# Patient Record
Sex: Female | Born: 1995 | Race: White | Hispanic: No | Marital: Single | Smoking: Former smoker
Health system: Southern US, Community
[De-identification: ages and names within clinical notes are randomized; demographics above are authoritative.]

## PROBLEM LIST (undated history)

## (undated) DIAGNOSIS — K589 Irritable bowel syndrome without diarrhea: Secondary | ICD-10-CM

## (undated) DIAGNOSIS — F909 Attention-deficit hyperactivity disorder, unspecified type: Secondary | ICD-10-CM

## (undated) DIAGNOSIS — G43909 Migraine, unspecified, not intractable, without status migrainosus: Secondary | ICD-10-CM

## (undated) DIAGNOSIS — F419 Anxiety disorder, unspecified: Secondary | ICD-10-CM

## (undated) DIAGNOSIS — J45909 Unspecified asthma, uncomplicated: Secondary | ICD-10-CM

## (undated) DIAGNOSIS — F329 Major depressive disorder, single episode, unspecified: Secondary | ICD-10-CM

## (undated) DIAGNOSIS — F39 Unspecified mood [affective] disorder: Secondary | ICD-10-CM

## (undated) DIAGNOSIS — F32A Depression, unspecified: Secondary | ICD-10-CM

## (undated) HISTORY — DX: Irritable bowel syndrome, unspecified: K58.9

## (undated) HISTORY — DX: Unspecified mood (affective) disorder: F39

## (undated) HISTORY — PX: ROOT CANAL: SHX2363

## (undated) HISTORY — PX: TYMPANOSTOMY TUBE PLACEMENT: SHX32

## (undated) HISTORY — DX: Attention-deficit hyperactivity disorder, unspecified type: F90.9

---
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
## 2017-03-06 ENCOUNTER — Encounter (HOSPITAL_COMMUNITY): Payer: Self-pay | Admitting: Emergency Medicine

## 2017-03-06 ENCOUNTER — Emergency Department (HOSPITAL_COMMUNITY)
Admission: EM | Admit: 2017-03-06 | Discharge: 2017-03-06 | Disposition: A | Discharge status: Home / Self Care | Payer: 59 | Attending: Emergency Medicine | Admitting: Emergency Medicine

## 2017-03-06 DIAGNOSIS — G43829 Menstrual migraine, not intractable, without status migrainosus: Secondary | ICD-10-CM | POA: Insufficient documentation

## 2017-03-06 DIAGNOSIS — Z79899 Other long term (current) drug therapy: Secondary | ICD-10-CM | POA: Insufficient documentation

## 2017-03-06 DIAGNOSIS — R51 Headache: Secondary | ICD-10-CM | POA: Diagnosis present

## 2017-03-06 MED ORDER — PROCHLORPERAZINE EDISYLATE 5 MG/ML IJ SOLN
10.0000 mg | Freq: Once | INTRAMUSCULAR | Status: AC
  Administered 2017-03-06: 10 mg via INTRAVENOUS
  Filled 2017-03-06: qty 2

## 2017-03-06 MED ORDER — DIPHENHYDRAMINE HCL 50 MG/ML IJ SOLN
25.0000 mg | Freq: Once | INTRAMUSCULAR | Status: AC
  Administered 2017-03-06: 25 mg via INTRAVENOUS
  Filled 2017-03-06: qty 1

## 2017-03-06 MED ORDER — KETOROLAC TROMETHAMINE 30 MG/ML IJ SOLN
30.0000 mg | Freq: Once | INTRAMUSCULAR | Status: AC
  Administered 2017-03-06: 30 mg via INTRAVENOUS
  Filled 2017-03-06: qty 1

## 2017-03-06 MED ORDER — SODIUM CHLORIDE 0.9 % IV BOLUS (SEPSIS)
1000.0000 mL | Freq: Once | INTRAVENOUS | Status: AC
  Administered 2017-03-06: 1000 mL via INTRAVENOUS

## 2017-03-06 NOTE — ED Notes (Signed)
Requested urine from patient. 

## 2017-03-06 NOTE — ED Notes (Signed)
Bed: [facility] Expected date:  Expected time:  Means of arrival:  Comments: 

## 2017-03-06 NOTE — ED Provider Notes (Signed)
[facility]-EMERGENCY DEPT Provider Note   CSN: [id number] Arrival date & time: 03/06/17  0509     History   Chief Complaint Chief Complaint  Patient presents with  . Migraine  . Emesis    HPI Melissa Escobar is a 21 y.o. female.  HPI  This is a 21 year old female with a history of migraines who presents with headache. Patient reports worsening frontal headache since last night. She has a history of menstrual migraines. She usually takes Imitrex. However, last night she states that she drank 3 alcoholic beverages and was afraid to take her Imitrex. She reports nonbilious, nonbloody emesis.  Patient currently rates her pain at 10 out of 10. It is frontal headache. No neck pain or fevers.  Past Medical History:  Diagnosis Date  . Anxiety   . Depression     There are no active problems to display for this patient.   History reviewed. No pertinent surgical history.  OB History    No data available       Home Medications    Prior to Admission medications   Medication Sig Start Date End Date Taking? Authorizing Provider  albuterol (PROVENTIL HFA;VENTOLIN HFA) 108 (90 Base) MCG/ACT inhaler Inhale 1-2 puffs into the lungs every 6 (six) hours as needed for wheezing or shortness of breath.    [provider]  cetirizine (ZYRTEC) 10 MG tablet Take 10 mg by mouth daily as needed for allergies.     [provider]  lisdexamfetamine (VYVANSE) 20 MG capsule Take 20 mg by mouth daily.    [provider]  montelukast (SINGULAIR) 10 MG tablet Take 10 mg by mouth at bedtime as needed (allergies).  11/16/14 11/09/16  [provider]  ondansetron (ZOFRAN) 8 MG tablet Take 8 mg by mouth every 8 (eight) hours as needed for nausea.  10/18/16   [provider]  oxyCODONE (ROXICODONE) 5 MG immediate release tablet Take 1 tablet (5 mg total) by mouth every 4 (four) hours as needed for severe pain. Patient not taking: Reported on 11/09/2016 02/29/16   Earley Favor, NP  SUMAtriptan (IMITREX) 50 MG tablet Take 50 mg by mouth every 2 (two) hours as needed for headache.  10/23/16   [provider]    Family History History reviewed. No pertinent family history.  Social History Social History  Substance Use Topics  . Smoking status: Never Smoker  . Smokeless tobacco: Never Used  . Alcohol use No     Allergies   Tamiflu [oseltamivir phosphate]   Review of Systems Review of Systems  Constitutional: Negative for fever.  Respiratory: Negative for shortness of breath.   Cardiovascular: Negative for chest pain.  Gastrointestinal: Positive for nausea and vomiting.  Musculoskeletal: Negative for neck pain and neck stiffness.  Neurological: Positive for headaches.  All other systems reviewed and are negative.    Physical Exam Updated Vital Signs BP 108/75 (BP Location: Left Arm)   Pulse 73   Temp (!) 97.5 F (36.4 C) (Oral)   Resp 15   Ht  (1.727 m)   Wt 59 kg (130 lb)   LMP 02/28/2017   SpO2 98%   BMI 19.77 kg/m   Physical Exam  Constitutional: She is oriented to person, place, and time. No distress.  HENT:  Head: Normocephalic and atraumatic.  Eyes: Pupils are equal, round, and reactive to light.  Neck: Normal range of motion. Neck supple.  Cardiovascular: Normal rate, regular rhythm and normal heart sounds.  Pulmonary/Chest: Effort normal and breath sounds normal. No respiratory distress. She has no wheezes.  Abdominal: Soft. There is no tenderness.  Neurological: She is alert and oriented to person, place, and time.  5 out of 5 strength in all 4 extremities, no dysmetria to finger-nose-finger  Skin: Skin is warm and dry.  Psychiatric: She has a normal mood and affect.  Nursing note and vitals reviewed.    ED Treatments / Results  Labs (all labs ordered are listed, but only abnormal results are displayed) Labs Reviewed  URINALYSIS, ROUTINE W REFLEX MICROSCOPIC  PREGNANCY, URINE    EKG  EKG  Interpretation None       Radiology No results found.  Procedures Procedures (including critical care time)  Medications Ordered in ED Medications  ketorolac (TORADOL) 30 MG/ML injection 30 mg (30 mg Intravenous Given 03/06/17 0623)  sodium chloride 0.9 % bolus 1,000 mL (1,000 mLs Intravenous New Bag/Given 03/06/17 4782)  diphenhydrAMINE (BENADRYL) injection 25 mg (25 mg Intravenous Given 03/06/17 0623)  prochlorperazine (COMPAZINE) injection 10 mg (10 mg Intravenous Given 03/06/17 9562)     Initial Impression / Assessment and Plan / ED Course  I have reviewed the triage vital signs and the nursing notes.  Pertinent labs & imaging results that were available during my care of the patient were reviewed by me and considered in my medical decision making (see chart for details).  Clinical Course as of Mar 06 736  Wed Mar 06, 2017  0654 REsting comfortably.  Just received meds.  Will re-evaluate.  [CH]    Clinical Course User Index [CH] Taylin Mans, Mayer Masker, MD    Patient presents with migraine. Classic for her migraines. Patient given migraine cocktail and fluids. See clinical course above. On recheck, patient reports she feels better and is resting comfortably. Will discharge home with her mother.  After history, exam, and medical workup I feel the patient has been appropriately medically screened and is safe for discharge home. Pertinent diagnoses were discussed with the patient. Patient was given return precautions.   Final Clinical Impressions(s) / ED Diagnoses   Final diagnoses:  Menstrual migraine without status migrainosus, not intractable    New Prescriptions New Prescriptions   No medications on file     Shon Baton, MD 03/06/17 [id number]

## 2017-03-06 NOTE — ED Triage Notes (Signed)
Patient complaining of migraine that starting hurting yesterday. Patient states she has been vomiting. Patient states she feels faint and dehydrated. Patient has had alcohol so that is why she did not take her migraine medication (imitrex).

## 2017-03-06 NOTE — ED Notes (Signed)
Patient states she is a lesbian and does not have sex with men.

## 2017-12-23 ENCOUNTER — Encounter: Payer: Self-pay | Admitting: Obstetrics and Gynecology

## 2017-12-23 ENCOUNTER — Ambulatory Visit: Payer: 59 | Admitting: Obstetrics and Gynecology

## 2017-12-23 ENCOUNTER — Other Ambulatory Visit: Payer: Self-pay

## 2017-12-23 VITALS — BP 110/58 | HR 72 | Resp 16 | Ht 68.25 in | Wt 135.0 lb

## 2017-12-23 DIAGNOSIS — Z8669 Personal history of other diseases of the nervous system and sense organs: Secondary | ICD-10-CM

## 2017-12-23 NOTE — Patient Instructions (Addendum)
You will see the Neurologist, Dr. Everlena CooperJaffe, for consultation regarding your migraine headaches.   After your visit, please call the office to schedule a well woman visit with me.  This will be your first gynecology exam and we will do STD testing then as well.  We will talk about safe methods to help your menstrual headaches further at that time.  I need to know if we can use birth control with estrogen in it.   If you have migraine with aura, we cannot prescribe birth control with estrogen in it, due to increased risk of stroke.

## 2017-12-23 NOTE — Progress Notes (Signed)
GYNECOLOGY  VISIT   HPI: 22 y.o.   Single  Caucasian  female   [id number] with Patient's last menstrual period was 12/20/2017.   here for migraines with menstrual cycle.     Headache starts prior to menstruation and then throughout the menstrual period.  Headache is worse the first and last day of her cycle. Triggers are also not eating, alcohol consumption.  States she has severe migraines and has gone to the ER. Migraines since 8th grade. Saw a neurologist in the past, Dr[facility]. Injections were recommended, which she did not pursue, so she did not return there. Used Imitrex in the past.  Currently using Maxalt and phenergan suppository from her PCP.     Has migraine with aura once or twice.   Periods are regular.  Tampon change - superplus, every 2 - 3 hours at the worst.  No cramping.  Diarrhea during her menses.   Used birth control in the past.  Had anxiety.   States she has mood disorder, but she does not Lamictal anymore.  Saw Vernell LeepKen Frazier in the past who suggested bipolar disorder but she is not currently under care for this.   Does not have STD screening.  Has had 6 sexual partners in the past.  Wants to be very clear that she has not had sex with men.   GYNECOLOGIC HISTORY: Patient's last menstrual period was 12/20/2017. Contraception: female preference Menopausal hormone therapy:  n/a Last mammogram:  n/a Last pap smear:   n/a        OB History    Gravida  0   Para  0   Term  0   Preterm  0   AB  0   Living  0     SAB  0   TAB  0   Ectopic  0   Multiple  0   Live Births  0              There are no active problems to display for this patient.   Past Medical History:  Diagnosis Date  . ADHD   . Anxiety   . Depression   . IBS (irritable bowel syndrome)   . Mood disorder [facility](HCC)     Past Surgical History:  Procedure Laterality Date  . ROOT CANAL    . TYMPANOSTOMY TUBE PLACEMENT      Current  Outpatient Medications  Medication Sig Dispense Refill  . albuterol (PROVENTIL HFA;VENTOLIN HFA) 108 (90 Base) MCG/ACT inhaler Inhale 1-2 puffs into the lungs every 6 (six) hours as needed for wheezing or shortness of breath.    . cetirizine (ZYRTEC) 10 MG tablet Take 10 mg by mouth daily as needed for allergies.     Marland Kitchen. ibuprofen (ADVIL,MOTRIN) 800 MG tablet Take by mouth as needed.    Marland Kitchen. lisdexamfetamine (VYVANSE) 20 MG capsule Take 20 mg by mouth daily.    . montelukast (SINGULAIR) 10 MG tablet Take 10 mg by mouth at bedtime as needed (allergies).     . moxifloxacin (VIGAMOX) 0.5 % ophthalmic solution Place one drop into the right eye 3 (three) times a day.    . ondansetron (ZOFRAN) 8 MG tablet Take 8 mg by mouth every 8 (eight) hours as needed for nausea.     . rizatriptan (MAXALT) 10 MG tablet May repeat in 2 hours if needed     No current facility-administered medications for this visit.      ALLERGIES: Tamiflu [  oseltamivir phosphate]  Family History  Problem Relation Age of Onset  . Menstrual problems Mother   . Hypertension Mother   . Skin cancer Mother   . Breast cancer Maternal Grandmother   . Alcoholism Father   . Leukemia Maternal Grandfather   . Alcoholism Paternal Grandfather     Social History   Socioeconomic History  . Marital status: Single    Spouse name: Not on file  . Number of children: Not on file  . Years of education: Not on file  . Highest education level: Not on file  Occupational History  . Not on file  Social Needs  . Financial resource strain: Not on file  . Food insecurity:    Worry: Not on file    Inability: Not on file  . Transportation needs:    Medical: Not on file    Non-medical: Not on file  Tobacco Use  . Smoking status: Never Smoker  . Smokeless tobacco: Never Used  Substance and Sexual Activity  . Alcohol use: Yes    Comment: occasional  . Drug use: Yes    Types: Marijuana, Cocaine    Comment: 2-3 times per week/ Xanax  occasional  . Sexual activity: Yes    Birth control/protection: None    Comment: female preference  Lifestyle  . Physical activity:    Days per week: Not on file    Minutes per session: Not on file  . Stress: Not on file  Relationships  . Social connections:    Talks on phone: Not on file    Gets together: Not on file    Attends religious service: Not on file    Active member of club or organization: Not on file    Attends meetings of clubs or organizations: Not on file    Relationship status: Not on file  . Intimate partner violence:    Fear of current or ex partner: Not on file    Emotionally abused: Not on file    Physically abused: Not on file    Forced sexual activity: Not on file  Other Topics Concern  . Not on file  Social History Narrative  . Not on file    Review of Systems  Genitourinary:       Migraine with menstrual  All other systems reviewed and are negative.   PHYSICAL EXAMINATION:    BP (!) 110/58 (BP Location: Right Arm, Patient Position: Sitting, Cuff Size: Normal)   Pulse 72   Resp 16   Ht 5' 8.25" (1.734 m)   Wt 135 lb (61.2 kg)   LMP 12/20/2017   BMI 20.38 kg/m     General appearance: alert, cooperative and appears stated age Head: Normocephalic, without obvious abnormality, atraumatic Neck: no adenopathy, supple, symmetrical, trachea midline and thyroid normal to inspection and palpation Lungs: clear to auscultation bilaterally Heart: regular rate and rhythm Abdomen: soft, non-tender, no masses,  no organomegaly Extremities: extremities normal, atraumatic, no cyanosis or edema   Pelvic:  Deferred.  ASSESSMENT  Menstrual migraines.  Migraine headaches.  Unclear if it is with aura or not.  No prior STD testing.  Anxiety and mood disorder.  Anxiety with OCP use in past.   PLAN  We discussed her migraine headaches and how menstrual migraines can occur due to the changes in hormone levels during the menstrual cycle. We discussed  options for care for regulating her hormones such as combined oral contraception and progesterone only contraception.  I recommend she see a  neurologist to have a formal diagnosis of the type of migraine headache she has as this will make a difference in the type of prescription that is appropriate.  I have placed a referral to Dr. Everlena Cooper.  Patient does request a female provider. I did review that combined oral contraception is contraindicated for patient's with migraine with aura due to increased risk of stroke.   After her neurology visit, she will call to make an appointment for an annual well woman exam and have STD testing also performed.  She did ask me to write down the plan, and I did this for her for her after visit summary.  She also indicated that her mother would be nervous to hear some of this information today, as her mother has migraines with aura.  In the hallway after the visit, the patient asked to hug me to express her appreciation after completion of her visit.   She indicated she was pleased to have a home for her GYN care.   An After Visit Summary was printed and given to the patient.  __30____ minutes face to face time of which over 50% was spent in counseling.

## 2018-04-07 ENCOUNTER — Encounter (HOSPITAL_BASED_OUTPATIENT_CLINIC_OR_DEPARTMENT_OTHER): Payer: Self-pay

## 2018-04-07 ENCOUNTER — Other Ambulatory Visit: Payer: Self-pay

## 2018-04-07 ENCOUNTER — Emergency Department (HOSPITAL_BASED_OUTPATIENT_CLINIC_OR_DEPARTMENT_OTHER)
Admission: EM | Admit: 2018-04-07 | Discharge: 2018-04-07 | Disposition: A | Discharge status: Home / Self Care | Payer: Worker's Compensation | Attending: Emergency Medicine | Admitting: Emergency Medicine

## 2018-04-07 DIAGNOSIS — W228XXA Striking against or struck by other objects, initial encounter: Secondary | ICD-10-CM | POA: Diagnosis not present

## 2018-04-07 DIAGNOSIS — F1210 Cannabis abuse, uncomplicated: Secondary | ICD-10-CM | POA: Insufficient documentation

## 2018-04-07 DIAGNOSIS — Z79899 Other long term (current) drug therapy: Secondary | ICD-10-CM | POA: Insufficient documentation

## 2018-04-07 DIAGNOSIS — F17290 Nicotine dependence, other tobacco product, uncomplicated: Secondary | ICD-10-CM | POA: Insufficient documentation

## 2018-04-07 DIAGNOSIS — Y990 Civilian activity done for income or pay: Secondary | ICD-10-CM | POA: Diagnosis not present

## 2018-04-07 DIAGNOSIS — Y9289 Other specified places as the place of occurrence of the external cause: Secondary | ICD-10-CM | POA: Insufficient documentation

## 2018-04-07 DIAGNOSIS — Y9389 Activity, other specified: Secondary | ICD-10-CM | POA: Insufficient documentation

## 2018-04-07 DIAGNOSIS — S0990XA Unspecified injury of head, initial encounter: Secondary | ICD-10-CM

## 2018-04-07 DIAGNOSIS — S0101XA Laceration without foreign body of scalp, initial encounter: Secondary | ICD-10-CM | POA: Insufficient documentation

## 2018-04-07 NOTE — Discharge Instructions (Addendum)
Get help right away if: Your wound reopens and is draining. Your wound becomes red, swollen, hot, or tender. You develop a rash after the glue is applied. You have increasing pain in the wound. You have a red streak going away from the wound. You have pus coming from the wound. You have increased bleeding. You have a fever. You have shaking chills. You notice a bad smell coming from the wound. Your wound or the adhesive breaks open. 

## 2018-04-07 NOTE — ED Triage Notes (Signed)
Pt states a metal part of shoe holder fell on forehead today at work ~4pm- superficial lac noted-no LOC-NAD-steady gait

## 2018-04-07 NOTE — ED Provider Notes (Signed)
[facility] EMERGENCY DEPARTMENT Provider Note   CSN: [id number] Arrival date & time: 04/07/18  2015     History   Chief Complaint Chief Complaint  Patient presents with  . Head Injury    HPI Melissa Escobar is a 22 y.o. female who presents the emergency department with head injury.  Patient states that she was at work doing dressing work on a manikin when 1 of the heavy metal shoe holders fell and hit her on the right side of her forehead.  She had significant bleeding.  She says that hurst supervisor cleaned it with alcohol.  She did not lose consciousness.  She is up-to-date on her tetanus vaccination.   HPI  Past Medical History:  Diagnosis Date  . ADHD   . Anxiety   . Depression   . IBS (irritable bowel syndrome)   . Mood disorder [facility])     Patient Active Problem List   Diagnosis Date Noted  . History of migraine headaches 12/23/2017    Past Surgical History:  Procedure Laterality Date  . ROOT CANAL    . TYMPANOSTOMY TUBE PLACEMENT       OB History    Gravida  0   Para  0   Term  0   Preterm  0   AB  0   Living  0     SAB  0   TAB  0   Ectopic  0   Multiple  0   Live Births  0            Home Medications    Prior to Admission medications   Medication Sig Start Date End Date Taking? Authorizing Provider  albuterol (PROVENTIL HFA;VENTOLIN HFA) 108 (90 Base) MCG/ACT inhaler Inhale 1-2 puffs into the lungs every 6 (six) hours as needed for wheezing or shortness of breath.    [provider]  cetirizine (ZYRTEC) 10 MG tablet Take 10 mg by mouth daily as needed for allergies.     [provider]  ibuprofen (ADVIL,MOTRIN) 800 MG tablet Take by mouth as needed. 01/23/17   [provider]  lisdexamfetamine (VYVANSE) 20 MG capsule Take 20 mg by mouth daily.    [provider]  montelukast (SINGULAIR) 10 MG tablet Take 10 mg by mouth at bedtime as needed (allergies).  11/16/14 12/23/17  [provider]  moxifloxacin (VIGAMOX) 0.5 % ophthalmic solution Place one drop into the right eye 3 (three) times a day. 11/04/17   [provider]  ondansetron (ZOFRAN) 8 MG tablet Take 8 mg by mouth every 8 (eight) hours as needed for nausea.  10/18/16   [provider]  rizatriptan (MAXALT) 10 MG tablet May repeat in 2 hours if needed 11/29/17   [provider]    Family History Family History  Problem Relation Age of Onset  . Menstrual problems Mother   . Hypertension Mother   . Skin cancer Mother   . Breast cancer Maternal Grandmother   . Alcoholism Father   . Leukemia Maternal Grandfather   . Alcoholism Paternal Grandfather     Social History Social History   Tobacco Use  . Smoking status: Current Every Day Smoker    Types: E-cigarettes  . Smokeless tobacco: Never Used  Substance Use Topics  . Alcohol use: Not Currently  . Drug use: Yes    Types: Marijuana     Allergies   Tamiflu [oseltamivir phosphate]   Review of Systems Review of Systems  Ten systems reviewed and are negative for acute change, except as noted in the HPI.    Physical Exam Updated Vital Signs BP 108/65 (BP Location: Right Arm)   Pulse 85   Temp 98.4 F (36.9 C) (Oral)   Resp 18   Ht 5\' 9"  (1.753 m)   Wt 66.2 kg   LMP 03/31/2018   SpO2 100%   BMI 21.56 kg/m   Physical Exam  Constitutional: She is oriented to person, place, and time. She appears well-developed and well-nourished. No distress.  HENT:  Head: Normocephalic.  2 cm well aligned laceration, superficial right side of her forehead  Eyes: Conjunctivae are normal. No scleral icterus.  Neck: Normal range of motion.  Cardiovascular: Normal rate, regular rhythm and normal heart sounds. Exam reveals no gallop and no friction rub.  No murmur heard. Pulmonary/Chest: Effort normal and breath sounds normal. No respiratory distress.  Abdominal: Soft. Bowel sounds are normal. She exhibits no distension and no  mass. There is no tenderness. There is no guarding.  Musculoskeletal: Normal range of motion.  Neurological: She is alert and oriented to person, place, and time.  Speech is clear and goal oriented, follows commands Major Cranial nerves without deficit, no facial droop Normal strength in upper and lower extremities bilaterally including dorsiflexion and plantar flexion, strong and equal grip strength Sensation normal to light and sharp touch Moves extremities without ataxia, coordination intact Normal finger to nose and rapid alternating movements Neg romberg, no pronator drift Normal gait Normal heel-shin and balance   Skin: Skin is warm and dry. She is not diaphoretic.  Psychiatric: Her behavior is normal.  Nursing note and vitals reviewed.    ED Treatments / Results  Labs (all labs ordered are listed, but only abnormal results are displayed) Labs Reviewed - No data to display  EKG None  Radiology No results found.  Procedures .Marland KitchenLaceration Repair Date/Time: 04/07/2018 11:02 PM Performed by: Arthor Captain, PA-C Authorized by: Arthor Captain, PA-C   Consent:    Consent obtained:  Verbal   Risks discussed:  Infection, pain and poor cosmetic result Anesthesia (see MAR for exact dosages):    Anesthesia method:  None Laceration details:    Location:  Face   Face location:  Forehead   Length (cm):  2   Depth (mm):  2 Repair type:    Repair type:  Simple Pre-procedure details:    Preparation:  Patient was prepped and draped in usual sterile fashion Treatment:    Irrigation solution:  Sterile saline Skin repair:    Repair method:  Tissue adhesive Approximation:    Approximation:  Close Post-procedure details:    Patient tolerance of procedure:  Tolerated well, no immediate complications   (including critical care time)  Medications Ordered in ED Medications - No data to display   Initial Impression / Assessment and Plan / ED Course  I have reviewed the  triage vital signs and the nursing notes.  Pertinent labs & imaging results that were available during my care of the patient were reviewed by me and considered in my medical decision making (see chart for details).     Patient with superficial laceration of the right head.  Normal neurologic exam without loss of consciousness.  Patient has no headache.  No pain with eye movement.  Her laceration was repaired with tissue adhesive.  Discussed home care and return precautions.  She appears appropriate discharge at this time  Final Clinical Impressions(s) / ED Diagnoses   Final  diagnoses:  Minor head injury, initial encounter  Laceration of scalp without foreign body, initial encounter    ED Discharge Orders    None       Arthor Captain, PA-C 04/07/18 2340    Azalia Bilis, MD 04/08/18 0011

## 2018-10-10 ENCOUNTER — Encounter (HOSPITAL_COMMUNITY): Payer: Self-pay | Admitting: *Deleted

## 2018-10-10 ENCOUNTER — Emergency Department (HOSPITAL_COMMUNITY)
Admission: EM | Admit: 2018-10-10 | Discharge: 2018-10-10 | Disposition: A | Discharge status: Home / Self Care | Payer: No Typology Code available for payment source | Attending: Emergency Medicine | Admitting: Emergency Medicine

## 2018-10-10 ENCOUNTER — Other Ambulatory Visit: Payer: Self-pay

## 2018-10-10 DIAGNOSIS — J45909 Unspecified asthma, uncomplicated: Secondary | ICD-10-CM | POA: Insufficient documentation

## 2018-10-10 DIAGNOSIS — F17290 Nicotine dependence, other tobacco product, uncomplicated: Secondary | ICD-10-CM | POA: Insufficient documentation

## 2018-10-10 DIAGNOSIS — R4182 Altered mental status, unspecified: Secondary | ICD-10-CM | POA: Diagnosis present

## 2018-10-10 DIAGNOSIS — T50901A Poisoning by unspecified drugs, medicaments and biological substances, accidental (unintentional), initial encounter: Secondary | ICD-10-CM | POA: Insufficient documentation

## 2018-10-10 DIAGNOSIS — Z79899 Other long term (current) drug therapy: Secondary | ICD-10-CM | POA: Insufficient documentation

## 2018-10-10 HISTORY — DX: Unspecified asthma, uncomplicated: J45.909

## 2018-10-10 HISTORY — DX: Migraine, unspecified, not intractable, without status migrainosus: G43.909

## 2018-10-10 LAB — CBC
HCT: 34.4 % — ABNORMAL LOW (ref 36.0–46.0)
Hemoglobin: 11.2 g/dL — ABNORMAL LOW (ref 12.0–15.0)
MCH: 31.8 pg (ref 26.0–34.0)
MCHC: 32.6 g/dL (ref 30.0–36.0)
MCV: 97.7 fL (ref 80.0–100.0)
Platelets: 159 10*3/uL (ref 150–400)
RBC: 3.52 MIL/uL — ABNORMAL LOW (ref 3.87–5.11)
RDW: 12.8 % (ref 11.5–15.5)
WBC: 10.6 10*3/uL — ABNORMAL HIGH (ref 4.0–10.5)
nRBC: 0 % (ref 0.0–0.2)

## 2018-10-10 LAB — BASIC METABOLIC PANEL
Anion gap: 7 (ref 5–15)
BUN: 11 mg/dL (ref 6–20)
CO2: 22 mmol/L (ref 22–32)
Calcium: 8.1 mg/dL — ABNORMAL LOW (ref 8.9–10.3)
Chloride: 107 mmol/L (ref 98–111)
Creatinine, Ser: 0.77 mg/dL (ref 0.44–1.00)
GFR calc Af Amer: >60 mL/min (ref >60)
GFR calc non Af Amer: >60 mL/min (ref >60)
Glucose, Bld: 152 mg/dL — ABNORMAL HIGH (ref 70–99)
Potassium: 3.3 mmol/L — ABNORMAL LOW (ref 3.5–5.1)
Sodium: 136 mmol/L (ref 135–145)

## 2018-10-10 LAB — ACETAMINOPHEN LEVEL: Acetaminophen (Tylenol), Serum: <10 ug/mL — ABNORMAL LOW (ref 10–30)

## 2018-10-10 LAB — RAPID URINE DRUG SCREEN, HOSP PERFORMED
Amphetamines: NOT DETECTED
Barbiturates: NOT DETECTED
Benzodiazepines: POSITIVE — AB
Cocaine: NOT DETECTED
Opiates: NOT DETECTED
Tetrahydrocannabinol: POSITIVE — AB

## 2018-10-10 LAB — ETHANOL: Alcohol, Ethyl (B): <10 mg/dL (ref <10)

## 2018-10-10 LAB — I-STAT BETA HCG BLOOD, ED (MC, WL, AP ONLY): I-stat hCG, quantitative: <5 m[IU]/mL (ref <5)

## 2018-10-10 LAB — SALICYLATE LEVEL: Salicylate Lvl: <7 mg/dL (ref 2.8–30.0)

## 2018-10-10 MED ORDER — SODIUM CHLORIDE 0.9 % IV BOLUS
1000.0000 mL | Freq: Once | INTRAVENOUS | Status: AC
  Administered 2018-10-10: 03:00:00 1000 mL via INTRAVENOUS

## 2018-10-10 NOTE — ED Provider Notes (Signed)
[facility] EMERGENCY DEPARTMENT Provider Note   CSN: [id number] Arrival date & time: 10/10/18  0219    History   Chief Complaint Chief Complaint  Patient presents with  . Drug Overdose    HPI Melissa Escobar is a 23 y.o. female.     Patient presents to the emergency department after unintentional overdose.  Patient was at a party prior to arrival.  She reports that she crushed up what she thought was an oxycodone tablet and snorted it.  Patient became unresponsive.  First responders administered intranasal Narcan and assisted her breathing until she became more awake and alert.  At arrival she is groggy but answers all questions appropriately.  This was not an intentional overdose or suicide attempt.  She is now experiencing headache, does have a history of migraines.  This is not an unusual headache for her.     Past Medical History:  Diagnosis Date  . ADHD   . Anxiety   . Asthma   . Depression   . IBS (irritable bowel syndrome)   . Migraine   . Mood disorder [facility])     Patient Active Problem List   Diagnosis Date Noted  . History of migraine headaches 12/23/2017    Past Surgical History:  Procedure Laterality Date  . ROOT CANAL    . TYMPANOSTOMY TUBE PLACEMENT       OB History    Gravida  0   Para  0   Term  0   Preterm  0   AB  0   Living  0     SAB  0   TAB  0   Ectopic  0   Multiple  0   Live Births  0            Home Medications    Prior to Admission medications   Medication Sig Start Date End Date Taking? Authorizing Provider  albuterol (PROVENTIL HFA;VENTOLIN HFA) 108 (90 Base) MCG/ACT inhaler Inhale 1-2 puffs into the lungs every 6 (six) hours as needed for wheezing or shortness of breath.    [provider]  cetirizine (ZYRTEC) 10 MG tablet Take 10 mg by mouth daily as needed for allergies.     [provider]  ibuprofen (ADVIL,MOTRIN) 800 MG tablet Take by mouth as needed. 01/23/17    [provider]  lisdexamfetamine (VYVANSE) 20 MG capsule Take 20 mg by mouth daily.    [provider]  montelukast (SINGULAIR) 10 MG tablet Take 10 mg by mouth at bedtime as needed (allergies).  11/16/14 12/23/17  [provider]  moxifloxacin (VIGAMOX) 0.5 % ophthalmic solution Place one drop into the right eye 3 (three) times a day. 11/04/17   [provider]  ondansetron (ZOFRAN) 8 MG tablet Take 8 mg by mouth every 8 (eight) hours as needed for nausea.  10/18/16   [provider]  rizatriptan (MAXALT) 10 MG tablet May repeat in 2 hours if needed 11/29/17   [provider]    Family History Family History  Problem Relation Age of Onset  . Menstrual problems Mother   . Hypertension Mother   . Skin cancer Mother   . Breast cancer Maternal Grandmother   . Alcoholism Father   . Leukemia Maternal Grandfather   . Alcoholism Paternal Grandfather     Social History Social History   Tobacco Use  . Smoking status: Current Every Day Smoker    Types: E-cigarettes  . Smokeless tobacco:  Never Used  Substance Use Topics  . Alcohol use: Not Currently  . Drug use: Yes    Types: Marijuana     Allergies   Tamiflu [oseltamivir phosphate]   Review of Systems Review of Systems  Neurological: Positive for headaches.  All other systems reviewed and are negative.    Physical Exam Updated Vital Signs BP 102/62   Pulse 65   Temp 97.7 F (36.5 C) (Oral)   Resp 12   Ht  (1.727 m)   Wt 60.3 kg   LMP 10/09/2018   SpO2 97%   BMI 20.22 kg/m   Physical Exam Vitals signs and nursing note reviewed.  Constitutional:      General: She is not in acute distress.    Appearance: Normal appearance. She is well-developed.  HENT:     Head: Normocephalic and atraumatic.     Right Ear: Hearing normal.     Left Ear: Hearing normal.     Nose: Nose normal.  Eyes:     Conjunctiva/sclera: Conjunctivae normal.     Pupils: Pupils are equal,  round, and reactive to light.  Neck:     Musculoskeletal: Normal range of motion and neck supple.  Cardiovascular:     Rate and Rhythm: Regular rhythm.     Heart sounds: S1 normal and S2 normal. No murmur. No friction rub. No gallop.   Pulmonary:     Effort: Pulmonary effort is normal. No respiratory distress.     Breath sounds: Normal breath sounds.  Chest:     Chest wall: No tenderness.  Abdominal:     General: Bowel sounds are normal.     Palpations: Abdomen is soft.     Tenderness: There is no abdominal tenderness. There is no guarding or rebound. Negative signs include Murphy's sign and McBurney's sign.     Hernia: No hernia is present.  Musculoskeletal: Normal range of motion.  Skin:    General: Skin is warm and dry.     Findings: No rash.  Neurological:     Mental Status: She is alert and oriented to person, place, and time.     GCS: GCS eye subscore is 4. GCS verbal subscore is 5. GCS motor subscore is 6.     Cranial Nerves: No cranial nerve deficit.     Sensory: No sensory deficit.     Coordination: Coordination normal.  Psychiatric:        Speech: Speech normal.        Behavior: Behavior normal.        Thought Content: Thought content normal.      ED Treatments / Results  Labs (all labs ordered are listed, but only abnormal results are displayed) Labs Reviewed  CBC - Abnormal; Notable for the following components:      Result Value   WBC 10.6 (*)    RBC 3.52 (*)    Hemoglobin 11.2 (*)    HCT 34.4 (*)    All other components within normal limits  BASIC METABOLIC PANEL - Abnormal; Notable for the following components:   Potassium 3.3 (*)    Glucose, Bld 152 (*)    Calcium 8.1 (*)    All other components within normal limits  RAPID URINE DRUG SCREEN, HOSP PERFORMED - Abnormal; Notable for the following components:   Benzodiazepines POSITIVE (*)    Tetrahydrocannabinol POSITIVE (*)    All other components within normal limits  ACETAMINOPHEN LEVEL -  Abnormal; Notable for the following components:  Acetaminophen (Tylenol), Serum <10 (*)    All other components within normal limits  ETHANOL  SALICYLATE LEVEL  I-STAT BETA HCG BLOOD, ED ([facility], [facility], [facility] ONLY)    EKG EKG Interpretation  Date/Time:  Friday Oct 10 2018 02:31:08 EDT Ventricular Rate:  76 PR Interval:    QRS Duration: 112 QT Interval:  409 QTC Calculation: 460 R Axis:   34 Text Interpretation:  Incomplete analysis due to missing data in precordial lead(s) Sinus rhythm Borderline intraventricular conduction delay Missing lead(s): V4 No previous tracing Confirmed by Gilda CreasePollina,  J [id number]) on 10/10/2018 3:00:44 AM   Radiology No results found.  Procedures Procedures (including critical care time)  Medications Ordered in ED Medications  sodium chloride 0.9 % bolus 1,000 mL (0 mLs Intravenous Stopped 10/10/18 0404)     Initial Impression / Assessment and Plan / ED Course  I have reviewed the triage vital signs and the nursing notes.  Pertinent labs & imaging results that were available during my care of the patient were reviewed by me and considered in my medical decision making (see chart for details).        Patient presents to the emergency department after unintentional drug overdose.  It is unclear what she took.  She thought she was taking a hydrocodone.  She reports that she crushed up the tablet and snorted it.  Sometime after that she became unresponsive and was noted to have decreased respiratory rate, possible apnea.  First responders administered Narcan and assisted her breathing until she became more awake and alert.  At arrival she is groggy but able to answer all questions.  She has been monitored for an extended period of time and has done well.  He has been ambulatory here in the emergency department, appropriate for discharge.  She was given resources for substance abuse problems.  Final Clinical Impressions(s) / ED Diagnoses   Final  diagnoses:  Accidental drug overdose, initial encounter    ED Discharge Orders    None       Gilda CreasePollina,  J, MD 10/10/18 [phone]

## 2018-10-10 NOTE — ED Notes (Signed)
Called pt mother, Lanora Manis, at [phone], updated per patient consent. She will be available for patient at time of discharge.

## 2018-10-10 NOTE — ED Notes (Signed)
Pt called her mother from her personal cell phone to come and get her.

## 2018-10-10 NOTE — ED Notes (Signed)
Pt mother called, updated on status

## 2018-10-10 NOTE — ED Triage Notes (Signed)
Pt arrives via EMS. She crushed 1/2 pill oxycodone (approx 15 mg) and snorted it. Friends noticed pt was not breathing. On [employer], pt unresponsive, they bagged her,  2mg  Narcan given. On  EMS arrival, vitals were 112/76, weak pulse. She became agitated initially,  now alert oriented. En route 500 NS, 18 G left hand,  HR 76, 97/63. Does c/o nausea and migraine (hx of the same) Refused CBG.

## 2018-10-10 NOTE — ED Notes (Signed)
Patient aware that we need urine sample for testing, unable at this time. Pt given instruction on providing urine sample when able to do so.   

## 2018-10-10 NOTE — ED Notes (Signed)
Pt independently ambulatory to the restroom to provide urine sample.  

## 2019-04-07 ENCOUNTER — Other Ambulatory Visit: Payer: Self-pay

## 2019-04-07 DIAGNOSIS — Z20822 Contact with and (suspected) exposure to covid-19: Secondary | ICD-10-CM

## 2019-04-08 LAB — NOVEL CORONAVIRUS, NAA: SARS-CoV-2, NAA: NOT DETECTED

## 2019-07-08 ENCOUNTER — Encounter (HOSPITAL_COMMUNITY): Payer: Self-pay | Admitting: Emergency Medicine

## 2019-07-08 ENCOUNTER — Emergency Department (HOSPITAL_COMMUNITY)
Admission: EM | Admit: 2019-07-08 | Discharge: 2019-07-08 | Disposition: A | Discharge status: Home / Self Care | Payer: PRIVATE HEALTH INSURANCE | Attending: Emergency Medicine | Admitting: Emergency Medicine

## 2019-07-08 ENCOUNTER — Other Ambulatory Visit: Payer: Self-pay

## 2019-07-08 DIAGNOSIS — Z79899 Other long term (current) drug therapy: Secondary | ICD-10-CM | POA: Insufficient documentation

## 2019-07-08 DIAGNOSIS — R519 Headache, unspecified: Secondary | ICD-10-CM | POA: Diagnosis present

## 2019-07-08 DIAGNOSIS — G43809 Other migraine, not intractable, without status migrainosus: Secondary | ICD-10-CM | POA: Diagnosis not present

## 2019-07-08 DIAGNOSIS — F17200 Nicotine dependence, unspecified, uncomplicated: Secondary | ICD-10-CM | POA: Insufficient documentation

## 2019-07-08 NOTE — ED Notes (Signed)
Pt verbalized understanding of discharge instructions. Follow up care reviewed, pt ambulated independently to lobby. 

## 2019-07-08 NOTE — ED Notes (Signed)
EMS IV Tatyana Biber Mother [phone] looking for an update

## 2019-07-08 NOTE — Discharge Instructions (Addendum)
1.  Call [facility] to schedule a follow-up from the emergency department as soon as possible. 2.  Return to the emergency department if you have migraines that are different or worsening or other concerning symptoms develop or persist.

## 2019-07-08 NOTE — ED Provider Notes (Signed)
[facility] EMERGENCY DEPARTMENT Provider Note   CSN: [id number] Arrival date & time: 07/08/19  1659     History Chief Complaint  Patient presents with  . Migraine    Melissa Escobar is a 24 y.o. female.  HPI Patient reports that she has gotten migraines for a very long time.  She gets them consistently around her menstrual cycle.  She reports she is currently having a menstrual cycle.  She has had several episodes of atypical presentations.  She reports sometimes she does get flashing lights.  This morning she had noted some flashing and was going into her mom's office to talk to her.  Her mom reports that she was not making sense in some of the things that she was saying.  She seemed like she was having a hard time figuring out what to say.  Symptoms have since completely resolved.  Patient reports that she feels back to normal and headache has resolved.  Reports she went to bed last night with a moderate migraine and when she woke up this morning it did not seem to have completely resolved.  She was seen at her doctor's office and there was concern of possible strokelike symptoms.  Patient was sent to the emergency department for further evaluation of possible stroke.  Patient's mother reports that there was another episode whereupon the patient had gotten confused and seemed to have backed her car into the wrong place.  She had called her on the phone and she seemed a little disoriented.  This happened several weeks ago.  Patient denies she has been ill.  She has not had fevers chills.  She reports she takes Maxalt when she gets a headache but it does not always resolve the headache.  Patient denies she has any difficulty walking.  Does not feel that she has imbalance.  Does not experience any visual changes except around a migraine.    Past Medical History:  Diagnosis Date  . ADHD   . Anxiety   . Asthma   . Depression   . IBS (irritable bowel syndrome)   . Migraine     . Mood disorder [facility])     Patient Active Problem List   Diagnosis Date Noted  . History of migraine headaches 12/23/2017    Past Surgical History:  Procedure Laterality Date  . ROOT CANAL    . TYMPANOSTOMY TUBE PLACEMENT       OB History    Gravida  0   Para  0   Term  0   Preterm  0   AB  0   Living  0     SAB  0   TAB  0   Ectopic  0   Multiple  0   Live Births  0           Family History  Problem Relation Age of Onset  . Menstrual problems Mother   . Hypertension Mother   . Skin cancer Mother   . Breast cancer Maternal Grandmother   . Alcoholism Father   . Leukemia Maternal Grandfather   . Alcoholism Paternal Grandfather     Social History   Tobacco Use  . Smoking status: Current Every Day Smoker    Types: E-cigarettes  . Smokeless tobacco: Never Used  Substance Use Topics  . Alcohol use: Not Currently  . Drug use: Yes    Types: Marijuana    Home Medications Prior to Admission medications   Medication  Sig Start Date End Date Taking? Authorizing Provider  albuterol (PROVENTIL HFA;VENTOLIN HFA) 108 (90 Base) MCG/ACT inhaler Inhale 1-2 puffs into the lungs every 6 (six) hours as needed for wheezing or shortness of breath.    [provider]  cetirizine (ZYRTEC) 10 MG tablet Take 10 mg by mouth daily as needed for allergies.     [provider]  ibuprofen (ADVIL,MOTRIN) 800 MG tablet Take by mouth as needed. 01/23/17   [provider]  lisdexamfetamine (VYVANSE) 20 MG capsule Take 20 mg by mouth daily.    [provider]  montelukast (SINGULAIR) 10 MG tablet Take 10 mg by mouth at bedtime as needed (allergies).  11/16/14 12/23/17  [provider]  moxifloxacin (VIGAMOX) 0.5 % ophthalmic solution Place one drop into the right eye 3 (three) times a day. 11/04/17   [provider]  ondansetron (ZOFRAN) 8 MG tablet Take 8 mg by mouth every 8 (eight) hours as needed for nausea.  10/18/16    [provider]  rizatriptan (MAXALT) 10 MG tablet May repeat in 2 hours if needed 11/29/17   [provider]    Allergies    Tamiflu [oseltamivir phosphate]  Review of Systems   Review of Systems 10 Systems reviewed and are negative for acute change except as noted in the HPI.  Physical Exam Updated Vital Signs BP 112/71 (BP Location: Right Arm)   Pulse 86   Temp 98.3 F (36.8 C) (Oral)   Resp 17   LMP 07/06/2019   SpO2 100%   Physical Exam Constitutional:      Comments: Patient is alert and clinically well in appearance.  All of her movements are coordinated purposeful symmetric.  Her mental status is awake and comfortable in appearance.  Tall and thin but well-nourished and well-developed.  HENT:     Head: Normocephalic and atraumatic.     Mouth/Throat:     Mouth: Mucous membranes are moist.     Pharynx: Oropharynx is clear.  Eyes:     Extraocular Movements: Extraocular movements intact.     Conjunctiva/sclera: Conjunctivae normal.     Pupils: Pupils are equal, round, and reactive to light.     Comments: Extraocular motions are conjugate.  Normal visual fields x4 quadrants.  Cardiovascular:     Rate and Rhythm: Normal rate and regular rhythm.  Pulmonary:     Effort: Pulmonary effort is normal.     Breath sounds: Normal breath sounds.  Abdominal:     General: There is no distension.     Palpations: Abdomen is soft.     Tenderness: There is no abdominal tenderness. There is no guarding.  Musculoskeletal:        General: No swelling or tenderness. Normal range of motion.     Cervical back: Neck supple.  Skin:    General: Skin is warm and dry.  Neurological:     General: No focal deficit present.     Mental Status: She is oriented to person, place, and time.     Cranial Nerves: No cranial nerve deficit.     Motor: No weakness.     Coordination: Coordination normal.     Gait: Gait normal.     Comments: Mental status is clear.  Patient is alert with  no signs of somnolence or disorientation.  Speech is clear.  Cognitive function is normal.  Normal finger-nose exam bilaterally.  Heel toe walk normal.  All movements coordinated purposeful symmetric.  Normal visual fields.  Psychiatric:  Mood and Affect: Mood normal.     ED Results / Procedures / Treatments   Labs (all labs ordered are listed, but only abnormal results are displayed) Labs Reviewed - No data to display  EKG None  Radiology No results found.  Procedures Procedures (including critical care time)  Medications Ordered in ED Medications - No data to display  ED Course  I have reviewed the triage vital signs and the nursing notes.  Pertinent labs & imaging results that were available during my care of the patient were reviewed by me and considered in my medical decision making (see chart for details).    MDM Rules/Calculators/A&P                      Patient symptoms have completely resolved.  She went to bed with a headache last night.  She has a long history of migraine headaches since about eighth grade.  They typically occur around menstrual cycle which is the case at this time.  Her mother also has similar history of menstrual migraines.  No family history of early stroke or brain tumor.  At this time, I have low suspicion for stroke as etiology of symptoms.  Patient went to bed with a headache which she considered typical migraine and this morning experienced some flashing light before symptoms observed by her mother of some difficulty with word finding and delayed or inappropriate speech.  Neurologic examination is normal.  I suspect this is a complex or atypical migraine pattern.  At this time, I do feel patient is stable to follow-up with neurology on expeditious basis.  Referral created for [facility] with contact information provided.  Turn precautions reviewed. Final Clinical Impression(s) / ED Diagnoses Final diagnoses:  Other migraine without  status migrainosus, not intractable    Rx / DC Orders ED Discharge Orders    None       Charlesetta Shanks, MD 07/08/19 2000

## 2019-07-08 NOTE — ED Triage Notes (Signed)
Pt BIB [facility] from doctor's office. Pt woke up from nap this afternoon around 2pm, mother reports pt appeared confused and had expressive aphasia. Symptoms resolved at this time. Hx migraines, pt states she had a headache last night in to this morning.

## 2019-07-15 ENCOUNTER — Other Ambulatory Visit: Payer: Self-pay

## 2019-07-15 ENCOUNTER — Ambulatory Visit: Payer: PRIVATE HEALTH INSURANCE | Admitting: Neurology

## 2019-07-15 ENCOUNTER — Encounter: Payer: Self-pay | Admitting: Neurology

## 2019-07-15 VITALS — BP 104/64 | HR 80 | Temp 98.0°F | Ht 70.0 in | Wt 125.0 lb

## 2019-07-15 DIAGNOSIS — G43009 Migraine without aura, not intractable, without status migrainosus: Secondary | ICD-10-CM

## 2019-07-15 MED ORDER — UBRELVY 50 MG PO TABS: 50.0000 mg | ORAL_TABLET | ORAL | 3 refills | Status: DC | PRN

## 2019-07-15 NOTE — Progress Notes (Signed)
Subjective:    Patient ID: RUCHY WILDRICK is a 24 y.o. female.  HPI     Huston Foley, MD, PhD [facility] [address], [address] [address] [place], [place] [address]  I saw patient, Adoria Kawamoto, as a referral from the emergency room for evaluation of migraine headaches.  The patient is unaccompanied today.  She is a 24 year old right-handed woman with an underlying medical history of asthma, irritable bowel syndrome, anxiety, depression, and ADHD, who presented to the emergency room on 07/08/2019 with a history of recurrent migraines.  She has been on Maxalt.  She has had some visual symptoms and also at times has been noted to be confused and has had difficulty speaking at times.  Neurological exam in the emergency room was nonfocal.  She did not have any scans done.  She did not have any symptomatic treatment in the ER. She had a head CT without contrast and maxillofacial CT without contrast on 07/10/2014 and I reviewed the results: IMPRESSION: CT HEAD: No acute intracranial process ; normal noncontrast CT of the head.   CT MAXILLOFACIAL: No facial fracture.   Mild LEFT periorbital soft tissue swelling without postseptal extent. She has had migraines since her teenage years.  She does not typically have a visual aura.  She has associated nausea and vomiting at times, she has associated photophobia.  She has been not Maxalt per primary care PA which has been helpful, migraine frequency is generally about 2-3 times per month, typically around her menstrual period.  Her mother is concerned about her taking the Maxalt for all this time, prior to that she was on Imitrex.  She has not had any other prescription medication, no preventatives.  She has a family history of migraines in her mother.  Patient does not smoke any cigarettes but vapes nicotine.  She smokes marijuana very occasionally.  She does not drink any alcohol.  She limits her caffeine but does drink on a regular  basis some caffeine.  She feels like she sleeps well.  She Admits that she may skip meals at times.  She does not always hydrate well with water she admits. Her Past Medical History Is Significant For: Past Medical History:  Diagnosis Date  . ADHD   . Anxiety   . Asthma   . Depression   . IBS (irritable bowel syndrome)   . Migraine   . Mood disorder (HCC)     Her Past Surgical History Is Significant For: Past Surgical History:  Procedure Laterality Date  . ROOT CANAL    . TYMPANOSTOMY TUBE PLACEMENT      Her Family History Is Significant For: Family History  Problem Relation Age of Onset  . Menstrual problems Mother   . Hypertension Mother   . Skin cancer Mother   . Breast cancer Maternal Grandmother   . Alcoholism Father   . Leukemia Maternal Grandfather   . Alcoholism Paternal Grandfather     Her Social History Is Significant For: Social History   Socioeconomic History  . Marital status: Single    Spouse name: Not on file  . Number of children: Not on file  . Years of education: Not on file  . Highest education level: Not on file  Occupational History  . Not on file  Tobacco Use  . Smoking status: Current Every Day Smoker    Types: E-cigarettes  . Smokeless tobacco: Never Used  Substance and Sexual Activity  . Alcohol use: Not Currently  .  Drug use: Yes    Types: Marijuana  . Sexual activity: Not on file  Other Topics Concern  . Not on file  Social History Narrative  . Not on file   Social Determinants of Health   Financial Resource Strain:   . Difficulty of Paying Living Expenses: Not on file  Food Insecurity:   . Worried About Programme researcher, broadcasting/film/video in the Last Year: Not on file  . Ran Out of Food in the Last Year: Not on file  Transportation Needs:   . Lack of Transportation (Medical): Not on file  . Lack of Transportation (Non-Medical): Not on file  Physical Activity:   . Days of Exercise per Week: Not on file  . Minutes of Exercise per Session:  Not on file  Stress:   . Feeling of Stress : Not on file  Social Connections:   . Frequency of Communication with Friends and Family: Not on file  . Frequency of Social Gatherings with Friends and Family: Not on file  . Attends Religious Services: Not on file  . Active Member of Clubs or Organizations: Not on file  . Attends Banker Meetings: Not on file  . Marital Status: Not on file    Her Allergies Are:  Allergies  Allergen Reactions  . Tamiflu [Oseltamivir Phosphate] Nausea And Vomiting  :   Her Current Medications Are:  Outpatient Encounter Medications as of 07/15/2019  Medication Sig  . albuterol (PROVENTIL HFA;VENTOLIN HFA) 108 (90 Base) MCG/ACT inhaler Inhale 1-2 puffs into the lungs every 6 (six) hours as needed for wheezing or shortness of breath.  . cetirizine (ZYRTEC) 10 MG tablet Take 10 mg by mouth daily as needed for allergies.   Marland Kitchen ibuprofen (ADVIL,MOTRIN) 800 MG tablet Take by mouth as needed.  Marland Kitchen lisdexamfetamine (VYVANSE) 20 MG capsule Take 20 mg by mouth daily.  . montelukast (SINGULAIR) 10 MG tablet Take 10 mg by mouth at bedtime as needed (allergies).   . moxifloxacin (VIGAMOX) 0.5 % ophthalmic solution Place one drop into the right eye 3 (three) times a day.  . ondansetron (ZOFRAN) 8 MG tablet Take 8 mg by mouth every 8 (eight) hours as needed for nausea.   . rizatriptan (MAXALT) 10 MG tablet May repeat in 2 hours if needed   No facility-administered encounter medications on file as of 07/15/2019.  :   Review of Systems:  Out of a complete 14 point review of systems, all are reviewed and negative with the exception of these symptoms as listed below:    Review of Systems  Neurological:       Here to discuss migraines. Pt repots 1-2 migraines per month could be more around her cycle.  Reports she is taking rizatriptan and it helps but is worried she maybe using it too much. Would like to discuss alternative meds.    Objective:  Neurological  Exam  Physical Exam Physical Examination:   Vitals:   07/15/19 1112  BP: 104/64  Pulse: 80  Temp: 98 F (36.7 C)  SpO2: 95%    General Examination: The patient is a very pleasant 24 y.o. female in no acute distress. She appears well-developed and well-nourished and well groomed.   HEENT: Normocephalic, atraumatic, pupils are equal, round and reactive to light and accommodation. Funduscopic exam is normal with sharp disc margins noted. Extraocular tracking is good without limitation to gaze excursion or nystagmus noted. Normal smooth pursuit is noted. Hearing is grossly intact. Face is symmetric with normal  facial animation and normal facial sensation. Speech is clear with no dysarthria noted. There is no hypophonia. There is no lip, neck/head, jaw or voice tremor. Neck is supple with full range of passive and active motion. There are no carotid bruits on auscultation. Oropharynx exam reveals: mild mouth dryness, adequate dental hygiene. Tongue protrudes centrally and palate elevates symmetrically.   Chest: Clear to auscultation without wheezing, rhonchi or crackles noted.  Heart: S1+S2+0, regular and normal without murmurs, rubs or gallops noted.   Abdomen: Soft, non-tender and non-distended with normal bowel sounds appreciated on auscultation.  Extremities: There is no pitting edema in the distal lower extremities bilaterally. Pedal pulses are intact.  Skin: Warm and dry without trophic changes noted.  Musculoskeletal: exam reveals no obvious joint deformities, tenderness or joint swelling or erythema.   Neurologically:  Mental status: The patient is awake, alert and oriented in all 4 spheres. Her immediate and remote memory, attention, language skills and fund of knowledge are appropriate. There is no evidence of aphasia, agnosia, apraxia or anomia. Speech is clear with normal prosody and enunciation. Thought process is linear. Mood is normal and affect is normal.  Cranial nerves II  - XII are as described above under HEENT exam. In addition: shoulder shrug is normal with equal shoulder height noted. Motor exam: Normal bulk, strength and tone is noted. There is no drift, tremor or rebound. Romberg is negative. Reflexes are 2+ throughout. Babinski: Toes are flexor bilaterally. Fine motor skills and coordination: intact with normal finger taps, normal hand movements, normal rapid alternating patting, normal foot taps and normal foot agility.  Cerebellar testing: No dysmetria or intention tremor on finger to nose testing. Heel to shin is unremarkable bilaterally. There is no truncal or gait ataxia.  Sensory exam: intact to light touch, vibration, temperature sense in the upper and lower extremities.  Gait, station and balance: She stands easily. No veering to one side is noted. No leaning to one side is noted. Posture is age-appropriate and stance is narrow based. Gait shows normal stride length and normal pace. No problems turning are noted. Tandem walk is unremarkable.   Assessment and Plan:   In summary, CAMDYN LADEN is a very pleasant 24 y.o.-year old female with an underlying medical history of asthma, irritable bowel syndrome, anxiety, depression, and ADHD, who Presents as a referral from the emergency room for evaluation of her migraines.  Her history and examination are not with migraines without aura without intractability.  She had a recent issue with problems getting her words out and thinking sluggishness when she had a migraine.  She has a nonfocal neurological exam and is reassured. She has been on a triptan for some years.  She would like to explore other options and is encouraged by her mother also to talk about alternatives, As I understand.  I talked to her about migraine management, including headache triggers, preventative treatment options and acute treatment options.  We may embark on a daily preventative if need be.  For now, she is encouraged to stay well rested at  night, will hydrated with water and monitor for headache triggers such as stress, dehydration, sleep deprivation, and skipping meals.  She is encouraged to keep A regular schedule for her mealtimes as well.  At this juncture, I do not believe she should have to be on a daily preventative.  For acute management I suggested Ubrelvy.  She has been on Imitrex in the past.  She is advised not to combine  Ubrelvy with Maxalt.  She can take 1 or the other.  I provided a new prescription and written instructions. I advised her to follow-up in 3 months, sooner if needed.  I answered all her questions today and she was in agreement.  Star Age, MD, PhD

## 2019-07-15 NOTE — Patient Instructions (Signed)
Your neurological exam is normal thankfully.  You do have migraines, you have had reasonably good results with the Maxalt but I think it would be worthwhile trying a new medication, called Ubrelvy, 50 mg strength: Take 1 pill at onset of migraine headache, may repeat in 2 hours, if needed.  May cause sedation and nausea.  Please do not take this new medication with the Maxalt, you can use 1 or the other.  I think, for now we can get by with as needed medication only.  At some point, we may discuss daily preventative medication for migraines.  Please try to hydrate better with water, 6 to 8 cups/day are recommended, try to keep a good sleep schedule.  Please try not to skip any meals.  Please remember, common headache triggers are: sleep deprivation, dehydration, overheating, stress, hypoglycemia or skipping meals and blood sugar fluctuations, excessive pain medications or excessive alcohol use or caffeine withdrawal. Some people have food triggers such as aged cheese, orange juice or chocolate, especially dark chocolate, or MSG (monosodium glutamate). Try to avoid these headache triggers as much possible. It may be helpful to keep a headache diary to figure out what makes your headaches worse or brings them on and what alleviates them. Some people report headache onset after exercise but studies have shown that regular exercise may actually prevent headaches from coming. If you have exercise-induced headaches, please make sure that you drink plenty of fluid before and after exercising and that you do not over do it and do not overheat.

## 2019-07-16 ENCOUNTER — Telehealth: Payer: Self-pay

## 2019-07-16 NOTE — Telephone Encounter (Signed)
Received fax for [place] PA. Pt was given discount/savings card on 07/15/2019 and will use this to get med from pharmacy.

## 2019-09-07 NOTE — Telephone Encounter (Signed)
Pt's mother Lanora Manis on [place] would like to come by tomorrow to pick up a copay card for her daughter. If you can please have one at the front desk for her to pick op.

## 2019-09-07 NOTE — Telephone Encounter (Signed)
Received PA request for pt's ubrelvy. It may be cheaper that the copay card if insurance will pay 100% so the PA is worth attempting. If not, pt may continue to use the copay card.  KeyCarmela Hurt. Completed via covermymeds, sent to [facility].  Should have a determination in 3-5 business days.

## 2019-09-07 NOTE — Telephone Encounter (Signed)
Copay card placed at the front desk for pick up.

## 2019-09-07 NOTE — Telephone Encounter (Signed)
PA was denied for ubrelvy by [facility]. Pt should continue to use her copay card.

## 2019-09-09 ENCOUNTER — Telehealth: Payer: Self-pay | Admitting: Neurology

## 2019-09-09 NOTE — Telephone Encounter (Signed)
Rep with covermymeds called to verify if an appeal for the PA denial would be processed for the pts ubrelvy  cb# [phone] Reference: [id number]

## 2019-09-10 NOTE — Telephone Encounter (Signed)
Pt will be using copay card. Appeal will not be initiated.

## 2019-10-14 ENCOUNTER — Other Ambulatory Visit: Payer: Self-pay

## 2019-10-14 ENCOUNTER — Encounter: Payer: Self-pay | Admitting: Family Medicine

## 2019-10-14 ENCOUNTER — Ambulatory Visit: Payer: PRIVATE HEALTH INSURANCE | Admitting: Family Medicine

## 2019-10-14 VITALS — BP 98/53 | HR 70 | Ht 69.0 in | Wt 133.0 lb

## 2019-10-14 DIAGNOSIS — G43009 Migraine without aura, not intractable, without status migrainosus: Secondary | ICD-10-CM

## 2019-10-14 NOTE — Patient Instructions (Signed)
We will continue Ubrelvy for migraine abortion. We will consider prevention in future if needed but it sounds like you are doing really well!  Stay well hydrated Eat regular meals. Manage stress levels. Follow with PCP regularly.   Follow up with me in 1 year   Migraine Headache A migraine headache is a very strong throbbing pain on one side or both sides of your head. This type of headache can also cause other symptoms. It can last from 4 hours to 3 days. Talk with your doctor about what things may bring on (trigger) this condition. What are the causes? The exact cause of this condition is not known. This condition may be triggered or caused by:  Drinking alcohol.  Smoking.  Taking medicines, such as: ? Medicine used to treat chest pain (nitroglycerin). ? Birth control pills. ? Estrogen. ? Some blood pressure medicines.  Eating or drinking certain products.  Doing physical activity. Other things that may trigger a migraine headache include:  Having a menstrual period.  Pregnancy.  Hunger.  Stress.  Not getting enough sleep or getting too much sleep.  Weather changes.  Tiredness (fatigue). What increases the risk?  Being 8-50 years old.  Being female.  Having a family history of migraine headaches.  Being Caucasian.  Having depression or anxiety.  Being very overweight. What are the signs or symptoms?  A throbbing pain. This pain may: ? Happen in any area of the head, such as on one side or both sides. ? Make it hard to do daily activities. ? Get worse with physical activity. ? Get worse around bright lights or loud noises.  Other symptoms may include: ? Feeling sick to your stomach (nauseous). ? Vomiting. ? Dizziness. ? Being sensitive to bright lights, loud noises, or smells.  Before you get a migraine headache, you may get warning signs (an aura). An aura may include: ? Seeing flashing lights or having blind spots. ? Seeing bright spots, halos,  or zigzag lines. ? Having tunnel vision or blurred vision. ? Having numbness or a tingling feeling. ? Having trouble talking. ? Having weak muscles.  Some people have symptoms after a migraine headache (postdromal phase), such as: ? Tiredness. ? Trouble thinking (concentrating). How is this treated?  Taking medicines that: ? Relieve pain. ? Relieve the feeling of being sick to your stomach. ? Prevent migraine headaches.  Treatment may also include: ? Having acupuncture. ? Avoiding foods that bring on migraine headaches. ? Learning ways to control your body functions (biofeedback). ? Therapy to help you know and deal with negative thoughts (cognitive behavioral therapy). Follow these instructions at home: Medicines  Take over-the-counter and prescription medicines only as told by your doctor.  Ask your doctor if the medicine prescribed to you: ? Requires you to avoid driving or using heavy machinery. ? Can cause trouble pooping (constipation). You may need to take these steps to prevent or treat trouble pooping:  Drink enough fluid to keep your pee (urine) pale yellow.  Take over-the-counter or prescription medicines.  Eat foods that are high in fiber. These include beans, whole grains, and fresh fruits and vegetables.  Limit foods that are high in fat and sugar. These include fried or sweet foods. Lifestyle  Do not drink alcohol.  Do not use any products that contain nicotine or tobacco, such as cigarettes, e-cigarettes, and chewing tobacco. If you need help quitting, ask your doctor.  Get at least 8 hours of sleep every night.  Limit and deal  with stress. General instructions      Keep a journal to find out what may bring on your migraine headaches. For example, write down: ? What you eat and drink. ? How much sleep you get. ? Any change in what you eat or drink. ? Any change in your medicines.  If you have a migraine headache: ? Avoid things that make your  symptoms worse, such as bright lights. ? It may help to lie down in a dark, quiet room. ? Do not drive or use heavy machinery. ? Ask your doctor what activities are safe for you.  Keep all follow-up visits as told by your doctor. This is important. Contact a doctor if:  You get a migraine headache that is different or worse than others you have had.  You have more than 15 headache days in one month. Get help right away if:  Your migraine headache gets very bad.  Your migraine headache lasts longer than 72 hours.  You have a fever.  You have a stiff neck.  You have trouble seeing.  Your muscles feel weak or like you cannot control them.  You start to lose your balance a lot.  You start to have trouble walking.  You pass out (faint).  You have a seizure. Summary  A migraine headache is a very strong throbbing pain on one side or both sides of your head. These headaches can also cause other symptoms.  This condition may be treated with medicines and changes to your lifestyle.  Keep a journal to find out what may bring on your migraine headaches.  Contact a doctor if you get a migraine headache that is different or worse than others you have had.  Contact your doctor if you have more than 15 headache days in a month. This information is not intended to replace advice given to you by your health care provider. Make sure you discuss any questions you have with your health care provider. Document Revised: 09/05/2018 Document Reviewed: 06/26/2018 Elsevier Patient Education  2020 [employer].

## 2019-10-14 NOTE — Progress Notes (Addendum)
PATIENT: Melissa Escobar DOB: 1995/12/31  REASON FOR VISIT: follow up HISTORY FROM: patient  Chief Complaint  Patient presents with  . Follow-up    rm 9, alone  . Migraine    pt states migraines have improved      HISTORY OF PRESENT ILLNESS: Today 10/14/19 Melissa Escobar is a 24 y.o. female here today for follow up. She has taken [place] about 5 times since being seen in 06/2019 and feels it works very well. She reports headaches have significantly improved. She has not had any confusion or aphasia since last being seen. Migraines are more frequent around menstrual cycle or if she skip meals. She is drinking more water. She has weaned xanax with assistance from a rehab facility. She continues to go to [facility]. She is feeling well today and without concerns.   HISTORY: (copied from Dr Teofilo Pod note on 07/15/2019)  I saw patient, Melissa Escobar, as a referral from the emergency room for evaluation of migraine headaches.  The patient is unaccompanied today.  She is a 24 year old right-handed woman with an underlying medical history of asthma, irritable bowel syndrome, anxiety, depression, and ADHD, who presented to the emergency room on 07/08/2019 with a history of recurrent migraines.  She has been on Maxalt.  She has had some visual symptoms and also at times has been noted to be confused and has had difficulty speaking at times.  Neurological exam in the emergency room was nonfocal.  She did not have any scans done.  She did not have any symptomatic treatment in the ER. She had a head CT without contrast and maxillofacial CT without contrast on 07/10/2014 and I reviewed the results: IMPRESSION: CT HEAD: No acute intracranial process ; normal noncontrast CT of the head.  CT MAXILLOFACIAL: No facial fracture.  Mild LEFT periorbital soft tissue swelling without postseptal extent. She has had migraines since her teenage years.  She does not typically have a visual aura.  She has associated nausea  and vomiting at times, she has associated photophobia.  She has been not Maxalt per primary care PA which has been helpful, migraine frequency is generally about 2-3 times per month, typically around her menstrual period.  Her mother is concerned about her taking the Maxalt for all this time, prior to that she was on Imitrex.  She has not had any other prescription medication, no preventatives.  She has a family history of migraines in her mother.  Patient does not smoke any cigarettes but vapes nicotine.  She smokes marijuana very occasionally.  She does not drink any alcohol.  She limits her caffeine but does drink on a regular basis some caffeine.  She feels like she sleeps well.  She Admits that she may skip meals at times.  She does not always hydrate well with water she admits.   REVIEW OF SYSTEMS: Out of a complete 14 system review of symptoms, the patient complains only of the following symptoms, migraines and all other reviewed systems are negative.  ALLERGIES: Allergies  Allergen Reactions  . Tamiflu [Oseltamivir Phosphate] Nausea And Vomiting    HOME MEDICATIONS: Outpatient Medications Prior to Visit  Medication Sig Dispense Refill  . albuterol (PROVENTIL HFA;VENTOLIN HFA) 108 (90 Base) MCG/ACT inhaler Inhale 1-2 puffs into the lungs every 6 (six) hours as needed for wheezing or shortness of breath.    . cetirizine (ZYRTEC) 10 MG tablet Take 10 mg by mouth daily as needed for allergies.     Marland Kitchen  ibuprofen (ADVIL,MOTRIN) 800 MG tablet Take by mouth as needed.    Marland Kitchen lisdexamfetamine (VYVANSE) 20 MG capsule Take 20 mg by mouth daily.    . montelukast (SINGULAIR) 10 MG tablet Take 10 mg by mouth at bedtime as needed (allergies).     . ondansetron (ZOFRAN) 8 MG tablet Take 8 mg by mouth every 8 (eight) hours as needed for nausea.     Marland Kitchen Ubrogepant (UBRELVY) 50 MG TABS Take 50 mg by mouth as needed (may repeat once in 2 hours. no more than 2 pills in 24 h.). 10 tablet 3  . moxifloxacin (VIGAMOX)  0.5 % ophthalmic solution Place one drop into the right eye 3 (three) times a day.    . rizatriptan (MAXALT) 10 MG tablet May repeat in 2 hours if needed     No facility-administered medications prior to visit.    PAST MEDICAL HISTORY: Past Medical History:  Diagnosis Date  . ADHD   . Anxiety   . Asthma   . Depression   . IBS (irritable bowel syndrome)   . Migraine   . Mood disorder (HCC)     PAST SURGICAL HISTORY: Past Surgical History:  Procedure Laterality Date  . ROOT CANAL    . TYMPANOSTOMY TUBE PLACEMENT      FAMILY HISTORY: Family History  Problem Relation Age of Onset  . Menstrual problems Mother   . Hypertension Mother   . Skin cancer Mother   . Breast cancer Maternal Grandmother   . Alcoholism Father   . Leukemia Maternal Grandfather   . Alcoholism Paternal Grandfather     SOCIAL HISTORY: Social History   Socioeconomic History  . Marital status: Single    Spouse name: Not on file  . Number of children: Not on file  . Years of education: Not on file  . Highest education level: Not on file  Occupational History  . Not on file  Tobacco Use  . Smoking status: Current Every Day Smoker    Types: E-cigarettes  . Smokeless tobacco: Never Used  Substance and Sexual Activity  . Alcohol use: Not Currently  . Drug use: Yes    Types: Marijuana  . Sexual activity: Not on file  Other Topics Concern  . Not on file  Social History Narrative  . Not on file   Social Determinants of Health   Financial Resource Strain:   . Difficulty of Paying Living Expenses:   Food Insecurity:   . Worried About Programme researcher, broadcasting/film/video in the Last Year:   . Barista in the Last Year:   Transportation Needs:   . Freight forwarder (Medical):   Marland Kitchen Lack of Transportation (Non-Medical):   Physical Activity:   . Days of Exercise per Week:   . Minutes of Exercise per Session:   Stress:   . Feeling of Stress :   Social Connections:   . Frequency of Communication with  Friends and Family:   . Frequency of Social Gatherings with Friends and Family:   . Attends Religious Services:   . Active Member of Clubs or Organizations:   . Attends Banker Meetings:   Marland Kitchen Marital Status:   Intimate Partner Violence:   . Fear of Current or Ex-Partner:   . Emotionally Abused:   Marland Kitchen Physically Abused:   . Sexually Abused:       PHYSICAL EXAM  Vitals:   10/14/19 1108  BP: (!) 98/53  Pulse: 70  Weight: 133 lb (60.3  kg)  Height: 5\' 9"  (1.753 m)   Body mass index is 19.64 kg/m.  Generalized: Well developed, in no acute distress  Cardiology: normal rate and rhythm, no murmur noted Respiratory: clear to auscultation bilaterally  Neurological examination  Mentation: Alert oriented to time, place, history taking. Follows all commands speech and language fluent Cranial nerve II-XII: Pupils were equal round reactive to light. Extraocular movements were full, visual field were full  Motor: The motor testing reveals 5 over 5 strength of all 4 extremities. Good symmetric motor tone is noted throughout.  Gait and station: Gait is normal.   DIAGNOSTIC DATA (LABS, IMAGING, TESTING) - I reviewed patient records, labs, notes, testing and imaging myself where available.  No flowsheet data found.   Lab Results  Component Value Date   WBC 10.6 (H) 10/10/2018   HGB 11.2 (L) 10/10/2018   HCT 34.4 (L) 10/10/2018   MCV 97.7 10/10/2018   PLT 159 10/10/2018      Component Value Date/Time   NA 136 10/10/2018 0239   K 3.3 (L) 10/10/2018 0239   CL 107 10/10/2018 0239   CO2 22 10/10/2018 0239   GLUCOSE 152 (H) 10/10/2018 0239   BUN 11 10/10/2018 0239   CREATININE 0.77 10/10/2018 0239   CALCIUM 8.1 (L) 10/10/2018 0239   GFRNONAA >60 10/10/2018 0239   GFRAA >60 10/10/2018 0239   No results found for: CHOL, HDL, LDLCALC, LDLDIRECT, TRIG, CHOLHDL No results found for: HGBA1C No results found for: VITAMINB12 No results found for: TSH     ASSESSMENT AND  PLAN 24 y.o. year old female  has a past medical history of ADHD, Anxiety, Asthma, Depression, IBS (irritable bowel syndrome), Migraine, and Mood disorder ([place]). here with     ICD-10-CM   1. Migraine without aura and without status migrainosus, not intractable  G43.009     Isaias Sakai is doing very well from a migraine perspective.  Roselyn Meier has been very helpful in migraine abortion.  She feels that migraine frequency and intensity have improved significantly.  She was encouraged to continue healthy lifestyle habits.  She will continue [place] as needed.  She was advised against regular use of Ubrelvy or other over-the-counter medications.  She may use Aleve as needed during menstrual cycle for treatment of migraines as well.  I have commended her on her efforts with Xanax cessation and continue therapy.  She will follow up with [place] in 1 year, sooner if needed.  She verbalizes understanding and agreement with this plan.   No orders of the defined types were placed in this encounter.    No orders of the defined types were placed in this encounter.     I spent 15 minutes with the patient. 50% of this time was spent counseling and educating patient on plan of care and medications.    Debbora Presto, FNP-C 10/14/2019, 12:28 PM [facility] [address] [place], [place] [address] [phone]  I reviewed the above note and documentation by the Nurse Practitioner and agree with the history, exam, assessment and plan as outlined above. I was available for consultation. Star Age, MD, PhD [facility])

## 2019-11-04 ENCOUNTER — Emergency Department (HOSPITAL_COMMUNITY)
Admission: EM | Admit: 2019-11-04 | Discharge: 2019-11-04 | Disposition: A | Discharge status: Home / Self Care | Payer: No Typology Code available for payment source | Attending: Emergency Medicine | Admitting: Emergency Medicine

## 2019-11-04 ENCOUNTER — Emergency Department (HOSPITAL_COMMUNITY): Payer: No Typology Code available for payment source

## 2019-11-04 ENCOUNTER — Telehealth: Payer: Self-pay | Admitting: Family Medicine

## 2019-11-04 ENCOUNTER — Encounter (HOSPITAL_COMMUNITY): Payer: Self-pay

## 2019-11-04 ENCOUNTER — Other Ambulatory Visit: Payer: Self-pay

## 2019-11-04 DIAGNOSIS — F17210 Nicotine dependence, cigarettes, uncomplicated: Secondary | ICD-10-CM | POA: Diagnosis not present

## 2019-11-04 DIAGNOSIS — R569 Unspecified convulsions: Secondary | ICD-10-CM | POA: Diagnosis not present

## 2019-11-04 DIAGNOSIS — J45909 Unspecified asthma, uncomplicated: Secondary | ICD-10-CM | POA: Insufficient documentation

## 2019-11-04 LAB — CBC WITH DIFFERENTIAL/PLATELET
Abs Immature Granulocytes: 0.02 10*3/uL (ref 0.00–0.07)
Basophils Absolute: 0 10*3/uL (ref 0.0–0.1)
Basophils Relative: 1 %
Eosinophils Absolute: 0.2 10*3/uL (ref 0.0–0.5)
Eosinophils Relative: 3 %
HCT: 38.5 % (ref 36.0–46.0)
Hemoglobin: 12.6 g/dL (ref 12.0–15.0)
Immature Granulocytes: 0 %
Lymphocytes Relative: 25 %
Lymphs Abs: 1.3 10*3/uL (ref 0.7–4.0)
MCH: 31.9 pg (ref 26.0–34.0)
MCHC: 32.7 g/dL (ref 30.0–36.0)
MCV: 97.5 fL (ref 80.0–100.0)
Monocytes Absolute: 0.4 10*3/uL (ref 0.1–1.0)
Monocytes Relative: 7 %
Neutro Abs: 3.5 10*3/uL (ref 1.7–7.7)
Neutrophils Relative %: 64 %
Platelets: 190 10*3/uL (ref 150–400)
RBC: 3.95 MIL/uL (ref 3.87–5.11)
RDW: 12.3 % (ref 11.5–15.5)
WBC: 5.4 10*3/uL (ref 4.0–10.5)
nRBC: 0 % (ref 0.0–0.2)

## 2019-11-04 LAB — HEPATIC FUNCTION PANEL
ALT: 14 U/L (ref 0–44)
AST: 18 U/L (ref 15–41)
Albumin: 4.1 g/dL (ref 3.5–5.0)
Alkaline Phosphatase: 35 U/L — ABNORMAL LOW (ref 38–126)
Bilirubin, Direct: 0.1 mg/dL (ref 0.0–0.2)
Indirect Bilirubin: 0.5 mg/dL (ref 0.3–0.9)
Total Bilirubin: 0.6 mg/dL (ref 0.3–1.2)
Total Protein: 6.8 g/dL (ref 6.5–8.1)

## 2019-11-04 LAB — URINALYSIS, ROUTINE W REFLEX MICROSCOPIC
Bilirubin Urine: NEGATIVE
Glucose, UA: NEGATIVE mg/dL
Hgb urine dipstick: NEGATIVE
Ketones, ur: 5 mg/dL — AB
Leukocytes,Ua: NEGATIVE
Nitrite: NEGATIVE
Protein, ur: 30 mg/dL — AB
Specific Gravity, Urine: 1.023 (ref 1.005–1.030)
pH: 7 (ref 5.0–8.0)

## 2019-11-04 LAB — BASIC METABOLIC PANEL
Anion gap: 9 (ref 5–15)
BUN: 12 mg/dL (ref 6–20)
CO2: 24 mmol/L (ref 22–32)
Calcium: 9.1 mg/dL (ref 8.9–10.3)
Chloride: 106 mmol/L (ref 98–111)
Creatinine, Ser: 0.79 mg/dL (ref 0.44–1.00)
GFR calc Af Amer: >60 mL/min (ref >60)
GFR calc non Af Amer: >60 mL/min (ref >60)
Glucose, Bld: 97 mg/dL (ref 70–99)
Potassium: 3.9 mmol/L (ref 3.5–5.1)
Sodium: 139 mmol/L (ref 135–145)

## 2019-11-04 LAB — I-STAT BETA HCG BLOOD, ED (MC, WL, AP ONLY): I-stat hCG, quantitative: <5 m[IU]/mL (ref <5)

## 2019-11-04 LAB — RAPID URINE DRUG SCREEN, HOSP PERFORMED
Amphetamines: NOT DETECTED
Barbiturates: NOT DETECTED
Benzodiazepines: POSITIVE — AB
Cocaine: NOT DETECTED
Opiates: NOT DETECTED
Tetrahydrocannabinol: POSITIVE — AB

## 2019-11-04 LAB — CBG MONITORING, ED: Glucose-Capillary: 104 mg/dL — ABNORMAL HIGH (ref 70–99)

## 2019-11-04 NOTE — Discharge Instructions (Signed)
Follow-up with your neurologist as discussed.  No driving or operating heavy machinery or high risk activities until you are cleared by neurology.

## 2019-11-04 NOTE — Telephone Encounter (Signed)
Patient's mother called today because patient had a seizure this morning. She states patient explained she had light in her vision right before the seizure, went stiff and friend called 911. She asked if patient could have a follow-up with Dr. Frances Furbish. I advised that Dr. Frances Furbish is on vacation, but I offered her an opening with Amy tomorrow and she accepted. FYI

## 2019-11-04 NOTE — ED Provider Notes (Signed)
[facility] EMERGENCY DEPARTMENT Provider Note   CSN: [id number] Arrival date & time: 11/04/19  0388     History Chief Complaint  Patient presents with  . Seizures    Melissa Escobar is a 24 y.o. female.  The history is provided by the patient.  Seizures Seizure activity on arrival: no   Preceding symptoms: aura   Initial focality:  Multifocal Episode characteristics: generalized shaking and unresponsiveness   Postictal symptoms: confusion   Severity:  Mild Duration:  1 minute Timing:  Once Context: drug use (occassional THC and xanax user. Denies recent heavy abuse of either. Use to be daily xanax user. Admits to using xanax maybe 5 times in the past month, none over the past week plus.)   Context: not alcohol withdrawal, medical compliance, not pregnant and not previous head injury   PTA treatment:  None History of seizures: no        Past Medical History:  Diagnosis Date  . ADHD   . Anxiety   . Asthma   . Depression   . IBS (irritable bowel syndrome)   . Migraine   . Mood disorder [facility])     Patient Active Problem List   Diagnosis Date Noted  . History of migraine headaches 12/23/2017    Past Surgical History:  Procedure Laterality Date  . ROOT CANAL    . TYMPANOSTOMY TUBE PLACEMENT       OB History    Gravida  0   Para  0   Term  0   Preterm  0   AB  0   Living  0     SAB  0   TAB  0   Ectopic  0   Multiple  0   Live Births  0           Family History  Problem Relation Age of Onset  . Menstrual problems Mother   . Hypertension Mother   . Skin cancer Mother   . Breast cancer Maternal Grandmother   . Alcoholism Father   . Leukemia Maternal Grandfather   . Alcoholism Paternal Grandfather     Social History   Tobacco Use  . Smoking status: Current Every Day Smoker    Types: E-cigarettes  . Smokeless tobacco: Never Used  Substance Use Topics  . Alcohol use: Not Currently  . Drug use: Yes    Types:  Marijuana    Home Medications Prior to Admission medications   Medication Sig Start Date End Date Taking? Authorizing Provider  albuterol (PROVENTIL HFA;VENTOLIN HFA) 108 (90 Base) MCG/ACT inhaler Inhale 1-2 puffs into the lungs every 6 (six) hours as needed for wheezing or shortness of breath.   Yes [provider]  busPIRone (BUSPAR) 7.5 MG tablet Take 7.5 mg by mouth 2 (two) times daily as needed.  10/26/19  Yes [provider]  cetirizine (ZYRTEC) 10 MG tablet Take 10 mg by mouth daily as needed for allergies.    Yes [provider]  Ubrogepant (UBRELVY) 50 MG TABS Take 50 mg by mouth as needed (may repeat once in 2 hours. no more than 2 pills in 24 h.). 07/15/19  Yes Huston Foley, MD    Allergies    Tamiflu [oseltamivir phosphate]  Review of Systems   Review of Systems  Constitutional: Negative for chills and fever.  HENT: Negative for ear pain and sore throat.   Eyes: Negative for pain and visual disturbance.  Respiratory: Negative for cough and  shortness of breath.   Cardiovascular: Negative for chest pain and palpitations.  Gastrointestinal: Negative for abdominal pain and vomiting.  Genitourinary: Negative for dysuria and hematuria.  Musculoskeletal: Negative for arthralgias and back pain.  Skin: Negative for color change and rash.  Neurological: Positive for seizures. Negative for dizziness, tremors, syncope, facial asymmetry, speech difficulty, weakness, light-headedness, numbness and headaches.  All other systems reviewed and are negative.   Physical Exam Updated Vital Signs  ED Triage Vitals  Enc Vitals Group     BP 11/04/19 0922 111/74     Pulse Rate 11/04/19 0922 82     Resp 11/04/19 0922 14     Temp 11/04/19 0922 98.1 F (36.7 C)     Temp Source 11/04/19 0922 Oral     SpO2 11/04/19 0918 97 %     Weight 11/04/19 0923 130 lb (59 kg)     Height 11/04/19 0923 5\' 9"  (1.753 m)     Head Circumference --      Peak Flow --      Pain Score  11/04/19 0922 3     Pain Loc --      Pain Edu? --      Excl. in GC? --     Physical Exam Vitals and nursing note reviewed.  Constitutional:      General: She is not in acute distress.    Appearance: She is well-developed. She is not ill-appearing.  HENT:     Head: Normocephalic and atraumatic.     Nose: Nose normal.     Mouth/Throat:     Mouth: Mucous membranes are moist.  Eyes:     Extraocular Movements: Extraocular movements intact.     Conjunctiva/sclera: Conjunctivae normal.     Pupils: Pupils are equal, round, and reactive to light.  Cardiovascular:     Rate and Rhythm: Normal rate and regular rhythm.     Pulses: Normal pulses.     Heart sounds: Normal heart sounds. No murmur.  Pulmonary:     Effort: Pulmonary effort is normal. No respiratory distress.     Breath sounds: Normal breath sounds.  Abdominal:     Palpations: Abdomen is soft.     Tenderness: There is no abdominal tenderness.  Musculoskeletal:        General: Normal range of motion.     Cervical back: Normal range of motion and neck supple.  Skin:    General: Skin is warm and dry.  Neurological:     General: No focal deficit present.     Mental Status: She is alert and oriented to person, place, and time.     Cranial Nerves: No cranial nerve deficit.     Sensory: No sensory deficit.     Motor: No weakness.     Coordination: Coordination normal.     Comments: 5+ out of 5 strength throughout, normal sensation, no drift, normal speech, normal finger-nose-finger  Psychiatric:        Mood and Affect: Mood normal.     ED Results / Procedures / Treatments   Labs (all labs ordered are listed, but only abnormal results are displayed) Labs Reviewed  HEPATIC FUNCTION PANEL - Abnormal; Notable for the following components:      Result Value   Alkaline Phosphatase 35 (*)    All other components within normal limits  RAPID URINE DRUG SCREEN, HOSP PERFORMED - Abnormal; Notable for the following components:    Benzodiazepines POSITIVE (*)    Tetrahydrocannabinol POSITIVE (*)  All other components within normal limits  URINALYSIS, ROUTINE W REFLEX MICROSCOPIC - Abnormal; Notable for the following components:   APPearance CLOUDY (*)    Ketones, ur 5 (*)    Protein, ur 30 (*)    Bacteria, UA RARE (*)    All other components within normal limits  CBG MONITORING, ED - Abnormal; Notable for the following components:   Glucose-Capillary 104 (*)    All other components within normal limits  CBC WITH DIFFERENTIAL/PLATELET  BASIC METABOLIC PANEL  I-STAT BETA HCG BLOOD, ED ([facility], [facility], [facility] ONLY)    EKG EKG Interpretation  Date/Time:  Wednesday November 04 2019 09:22:16 EDT Ventricular Rate:  83 PR Interval:    QRS Duration: 101 QT Interval:  383 QTC Calculation: 450 R Axis:   27 Text Interpretation: Sinus rhythm Confirmed by Lennice Sites [phone]) on 11/04/2019 9:38:34 AM   Radiology CT Head Wo Contrast  Result Date: 11/04/2019 CLINICAL DATA:  Initial seizure. EXAM: CT HEAD WITHOUT CONTRAST TECHNIQUE: Contiguous axial images were obtained from the base of the skull through the vertex without intravenous contrast. COMPARISON:  07/10/2014 FINDINGS: Brain: The brain shows a normal appearance without evidence of malformation, atrophy, old or acute small or large vessel infarction, mass lesion, hemorrhage, hydrocephalus or extra-axial collection. Vascular: No hyperdense vessel. No evidence of atherosclerotic calcification. Skull: Normal.  No traumatic finding.  No focal bone lesion. Sinuses/Orbits: Sinuses are clear. Orbits appear normal. Mastoids are clear. Other: None significant IMPRESSION: Normal head CT.  No abnormality seen to explain seizure. Electronically Signed   By: Nelson Chimes M.D.   On: 11/04/2019 10:54    Procedures Procedures (including critical care time)  Medications Ordered in ED Medications - No data to display  ED Course  I have reviewed the triage vital signs and the nursing  notes.  Pertinent labs & imaging results that were available during my care of the patient were reviewed by me and considered in my medical decision making (see chart for details).    MDM Rules/Calculators/A&P                      Melissa Escobar is a 24 year old female history of depression, anxiety, migraines who presents to the ED after seizure-like activity.  Patient with unremarkable vitals.  No fever.  Seizure-like activity this morning.  Appeared to have episode of full body shaking and possible nonresponsiveness and then slight postictal state.  Lasted for maybe a minute.  No history of seizures.  History of complex migraines and follows with neurology.  States that she felt the migraine type aura but does not have any headache right now.  No neck pain or neck stiffness.  No concern for meningitis on exam.  Neurologically she is intact.  She might be a little bit slowed and confused.  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  States that over the last several months that she has been fairly clean.  She does admit to using may be 5 times over the last month but nonetheless week or 2.  Does admit to marijuana use socially as well.  No alcohol use.  Patient overall appears well.  We will get basic labs, head CT.  EKG shows sinus rhythm.  No ischemic changes.  We will continue to observe.  She has been under a lot of stress and possibly could be pseudoseizures.  Will get urine drug screen.  She appears understand the risk of seizures if she is truly withdrawing  from Xanax but she is adamant that she does not use Xanax heavily anymore and states that she barely uses it.  Lab work overall unremarkable.  No significant anemia, electrolyte abnormality, kidney injury.  CT scan with no acute findings.  Patient completely back to her baseline.  Still adamant about occasional Xanax use.  She was positive for both marijuana and benzos on her drug screen.  However, she does not exhibit any signs of benzodiazepine  withdrawal on exam.  No diaphoresis, no tachycardia, no agitation.  She may have a primary seizure disorder or this is possibly pseudoseizure.  We will have her follow-up with her neurologist for EEG and possibly MRI.  Understands no driving or high risk activities until cleared by neurology.  Discharged from ED in good condition.  Given return precautions.  This chart was dictated using voice recognition software.  Despite best efforts to proofread,  errors can occur which can change the documentation meaning.    Final Clinical Impression(s) / ED Diagnoses Final diagnoses:  Seizure-like activity [facility])    Rx / DC Orders ED Discharge Orders    None       Virgina Norfolk, DO 11/04/19 1314

## 2019-11-04 NOTE — ED Notes (Signed)
Got patient undress into a gown on the monitor did ekg shown to Dr Lockie Mola call bell in reach and family at bedside

## 2019-11-04 NOTE — Telephone Encounter (Signed)
Noted  

## 2019-11-04 NOTE — ED Notes (Signed)
Pt transported to CT ?

## 2019-11-04 NOTE — ED Triage Notes (Signed)
Per [facility]: Pt from home, pt reports "seeing colors" then had a seizure that lasted about 2 minutes. No hx of seizures, was a little confused when EMS arrived. Pt currently complaining of headache. Girlfriend was with pt when this happened. Pt now A&O X 4. Pt does have some confusion, unable to say if she lives alone or with her mother.

## 2019-11-05 ENCOUNTER — Telehealth: Payer: Self-pay | Admitting: Family Medicine

## 2019-11-05 ENCOUNTER — Ambulatory Visit: Payer: PRIVATE HEALTH INSURANCE | Admitting: Family Medicine

## 2019-11-05 NOTE — Progress Notes (Deleted)
PATIENT: Melissa Escobar DOB: 08-14-1995  REASON FOR VISIT: follow up HISTORY FROM: patient  No chief complaint on file.    HISTORY OF PRESENT ILLNESS: Today 11/05/19 Melissa Escobar is a 24 y.o. female here today for follow up   HISTORY: (copied from my note on 10/14/2019)  Melissa Escobar is a 24 y.o. female here today for follow up. She has taken [place] about 5 times since being seen in 06/2019 and feels it works very well. She reports headaches have significantly improved. She has not had any confusion or aphasia since last being seen. Migraines are more frequent around menstrual cycle or if she skip meals. She is drinking more water. She has weaned xanax with assistance from a rehab facility. She continues to go to [facility]. She is feeling well today and without concerns.   HISTORY: (copied from Dr Guadelupe Sabin note on 07/15/2019)  I saw patient, Melissa Escobar, as a referral from the emergency room for evaluation of migraine headaches. The patient is unaccompanied today. She is a 24 year old right-handed woman with an underlying medical history of asthma, irritable bowel syndrome, anxiety, depression, and ADHD, who presented to the emergency room on 07/08/2019 with a history of recurrent migraines. She has been on Maxalt. She has had some visual symptoms and also at times has been noted to be confused and has had difficulty speaking at times. Neurological exam in the emergency room was nonfocal. She did not have any scans done. She did not have any symptomatic treatment in the ER. She had a head CT without contrast and maxillofacial CT without contrast on 07/10/2014 and I reviewed the results: IMPRESSION: CT HEAD: No acute intracranial process ; normal noncontrast CT of the head.  CT MAXILLOFACIAL: No facial fracture.  Mild LEFT periorbital soft tissue swelling without postseptal extent. She has had migraines since her teenage years. She does not typically have a visual aura. She has  associated nausea and vomiting at times, she has associated photophobia. She has been not Maxalt per primary care PA which has been helpful, migraine frequency is generally about 2-3 times per month, typically around her menstrual period. Her mother is concerned about her taking the Maxalt for all this time, prior to that she was on Imitrex. She has not had any other prescription medication, no preventatives. She has a family history of migraines in her mother. Patient does not smoke any cigarettes but vapes nicotine. She smokes marijuana very occasionally. She does not drink any alcohol. She limits her caffeine but does drink on a regular basis some caffeine. She feels like she sleeps well. SheAdmits that she may skip meals at times. She does not always hydrate well with water she admits.   REVIEW OF SYSTEMS: Out of a complete 14 system review of symptoms, the patient complains only of the following symptoms, and all other reviewed systems are negative.  ALLERGIES: Allergies  Allergen Reactions  . Tamiflu [Oseltamivir Phosphate] Nausea And Vomiting    HOME MEDICATIONS: Outpatient Medications Prior to Visit  Medication Sig Dispense Refill  . albuterol (PROVENTIL HFA;VENTOLIN HFA) 108 (90 Base) MCG/ACT inhaler Inhale 1-2 puffs into the lungs every 6 (six) hours as needed for wheezing or shortness of breath.    . busPIRone (BUSPAR) 7.5 MG tablet Take 7.5 mg by mouth 2 (two) times daily as needed.     . cetirizine (ZYRTEC) 10 MG tablet Take 10 mg by mouth daily as needed for allergies.     Marland Kitchen Ubrogepant (  UBRELVY) 50 MG TABS Take 50 mg by mouth as needed (may repeat once in 2 hours. no more than 2 pills in 24 h.). 10 tablet 3   No facility-administered medications prior to visit.    PAST MEDICAL HISTORY: Past Medical History:  Diagnosis Date  . ADHD   . Anxiety   . Asthma   . Depression   . IBS (irritable bowel syndrome)   . Migraine   . Mood disorder (HCC)     PAST SURGICAL  HISTORY: Past Surgical History:  Procedure Laterality Date  . ROOT CANAL    . TYMPANOSTOMY TUBE PLACEMENT      FAMILY HISTORY: Family History  Problem Relation Age of Onset  . Menstrual problems Mother   . Hypertension Mother   . Skin cancer Mother   . Breast cancer Maternal Grandmother   . Alcoholism Father   . Leukemia Maternal Grandfather   . Alcoholism Paternal Grandfather     SOCIAL HISTORY: Social History   Socioeconomic History  . Marital status: Single    Spouse name: Not on file  . Number of children: Not on file  . Years of education: Not on file  . Highest education level: Not on file  Occupational History  . Not on file  Tobacco Use  . Smoking status: Current Every Day Smoker    Types: E-cigarettes  . Smokeless tobacco: Never Used  Vaping Use  . Vaping Use: Every day  Substance and Sexual Activity  . Alcohol use: Not Currently  . Drug use: Yes    Types: Marijuana  . Sexual activity: Not on file  Other Topics Concern  . Not on file  Social History Narrative  . Not on file   Social Determinants of Health   Financial Resource Strain:   . Difficulty of Paying Living Expenses:   Food Insecurity:   . Worried About Programme researcher, broadcasting/film/video in the Last Year:   . Barista in the Last Year:   Transportation Needs:   . Freight forwarder (Medical):   Marland Kitchen Lack of Transportation (Non-Medical):   Physical Activity:   . Days of Exercise per Week:   . Minutes of Exercise per Session:   Stress:   . Feeling of Stress :   Social Connections:   . Frequency of Communication with Friends and Family:   . Frequency of Social Gatherings with Friends and Family:   . Attends Religious Services:   . Active Member of Clubs or Organizations:   . Attends Banker Meetings:   Marland Kitchen Marital Status:   Intimate Partner Violence:   . Fear of Current or Ex-Partner:   . Emotionally Abused:   Marland Kitchen Physically Abused:   . Sexually Abused:       PHYSICAL  EXAM  There were no vitals filed for this visit. There is no height or weight on file to calculate BMI.  Generalized: Well developed, in no acute distress  Cardiology: normal rate and rhythm, no murmur noted Respiratory: clear to auscultation bilaterally  Neurological examination  Mentation: Alert oriented to time, place, history taking. Follows all commands speech and language fluent Cranial nerve II-XII: Pupils were equal round reactive to light. Extraocular movements were full, visual field were full on confrontational test. Facial sensation and strength were normal. Uvula tongue midline. Head turning and shoulder shrug  were normal and symmetric. Motor: The motor testing reveals 5 over 5 strength of all 4 extremities. Good symmetric motor tone is noted  throughout.  Sensory: Sensory testing is intact to soft touch on all 4 extremities. No evidence of extinction is noted.  Coordination: Cerebellar testing reveals good finger-nose-finger and heel-to-shin bilaterally.  Gait and station: Gait is normal. Tandem gait is normal. Romberg is negative. No drift is seen.  Reflexes: Deep tendon reflexes are symmetric and normal bilaterally.   DIAGNOSTIC DATA (LABS, IMAGING, TESTING) - I reviewed patient records, labs, notes, testing and imaging myself where available.  No flowsheet data found.   Lab Results  Component Value Date   WBC 5.4 11/04/2019   HGB 12.6 11/04/2019   HCT 38.5 11/04/2019   MCV 97.5 11/04/2019   PLT 190 11/04/2019      Component Value Date/Time   NA 139 11/04/2019 0951   K 3.9 11/04/2019 0951   CL 106 11/04/2019 0951   CO2 24 11/04/2019 0951   GLUCOSE 97 11/04/2019 0951   BUN 12 11/04/2019 0951   CREATININE 0.79 11/04/2019 0951   CALCIUM 9.1 11/04/2019 0951   PROT 6.8 11/04/2019 0951   ALBUMIN 4.1 11/04/2019 0951   AST 18 11/04/2019 0951   ALT 14 11/04/2019 0951   ALKPHOS 35 (L) 11/04/2019 0951   BILITOT 0.6 11/04/2019 0951   GFRNONAA >60 11/04/2019 0951    GFRAA >60 11/04/2019 0951   No results found for: CHOL, HDL, LDLCALC, LDLDIRECT, TRIG, CHOLHDL No results found for: VIFB3P No results found for: VITAMINB12 No results found for: TSH     ASSESSMENT AND PLAN 24 y.o. year old female  has a past medical history of ADHD, Anxiety, Asthma, Depression, IBS (irritable bowel syndrome), Migraine, and Mood disorder (HCC). here with ***  No diagnosis found.     No orders of the defined types were placed in this encounter.    No orders of the defined types were placed in this encounter.     I spent 15 minutes with the patient. 50% of this time was spent counseling and educating patient on plan of care and medications.    Shawnie Dapper, FNP-C 11/05/2019, 12:48 PM [facility] [address], [address] [place], [place] [address] [phone]

## 2019-11-05 NOTE — Telephone Encounter (Signed)
Pt arrived 10 mins late to 06/10 appointment and was offered to be seen if any of the remaining patients today cancelled. Pt left and requested call back from nurse to see if they could be worked in early next week as the pt is going out of town and needs to be seen before then. Please advise with pt's mother, Lanora Manis, at [phone].

## 2019-11-06 ENCOUNTER — Encounter: Payer: Self-pay | Admitting: Family Medicine

## 2019-11-09 NOTE — Telephone Encounter (Signed)
Pt has been rescheduled for 7/8

## 2019-11-09 NOTE — Telephone Encounter (Signed)
Pt's mother called wanting to know if the pt will be able to be seen before Wednesday. Please advise.

## 2019-11-09 NOTE — Telephone Encounter (Signed)
Noted  

## 2019-12-03 ENCOUNTER — Other Ambulatory Visit: Payer: Self-pay

## 2019-12-03 ENCOUNTER — Ambulatory Visit: Payer: PRIVATE HEALTH INSURANCE | Admitting: Neurology

## 2019-12-03 ENCOUNTER — Encounter: Payer: Self-pay | Admitting: Neurology

## 2019-12-03 VITALS — BP 108/78 | HR 98 | Ht 69.0 in | Wt 138.0 lb

## 2019-12-03 DIAGNOSIS — G43009 Migraine without aura, not intractable, without status migrainosus: Secondary | ICD-10-CM

## 2019-12-03 DIAGNOSIS — R569 Unspecified convulsions: Secondary | ICD-10-CM | POA: Diagnosis not present

## 2019-12-03 NOTE — Patient Instructions (Signed)
As discussed, your prior CT scan and current neurological exam and previous neurological exam do not suggest any neurological underlying abnormality to account for the seizure.  It may have been provoked from benzodiazepine withdrawal.  Nevertheless, I would like for you to avoid situations that can increase the risk for seizures.  We will proceed with an EEG through our office and a brain MRI with and without contrast and we will call you with the results.  I do not believe you need to be on epilepsy medication at this time.  Please follow-up with Amy in about 5 months.  Please remember, common seizure triggers are: Sleep deprivation, dehydration, overheating, stress, hypoglycemia or skipping meals, certain medications or excessive alcohol use, especially stopping alcohol abruptly if you have had heavy alcohol use before (aka alcohol withdrawal seizure). If you have a prolonged seizure over 2-5 minutes or back to back seizures, call or have someone call 911 or take you to the nearest emergency room. You cannot drive a car or operate any other machinery or vehicle within 6 months of a seizure. Please do not swim alone or take a tub bath for safety. Do not climb on ladders or be at heights alone. Do not cook with large quantities of boiling water or oil for safety.  Use the back burner of the stove only when you cook.  Please ensure the water temperature at home is not too high. When carrying or caring for small children and infants, make sure you sit down when holding the child are feeding the child or changing them to minimize risk for injury to the child are to you if you were to have a seizure.   Avoid taking Wellbutrin, narcotic pain medications and tramadol, as they can lower seizure threshold.   Continue following with your outpatient rehab program to abstain from benzodiazepine.  Avoid drinking alcohol altogether because of your family history of alcoholism and your personal history of  benzodiazepine dependence.  As per [facility] statutes, patients with seizures and epilepsy are not allowed to drive until they have been seizure-free for at least 6 months. This also applies to driving or using heavy equipment or power tools.

## 2019-12-03 NOTE — Progress Notes (Signed)
Subjective:    Patient ID: Melissa Escobar is a 24 y.o. female.  HPI     Interim history:   Melissa Escobar is a 24 year old right-handed woman with an underlying medical history of mood disorder, irritable bowel syndrome, asthma, ADHD and migraine headaches, who presents for evaluation of a new problem, concern for recent seizure activity.  The patient is accompanied by Melissa Escobar mother today.  The patient was evaluated in the emergency room for a seizure-like event that day on 11/04/2019.  I reviewed the emergency room records.  UDS was positive for benzodiazepines and THC.  She has a prior history of benzodiazepine abuse but denied any recent overuse or withdrawal symptoms. I have previously seen Melissa Escobar for migraine evaluation. She had a head CT without contrast on 10/27/2019 and I reviewed the results: IMPRESSION: Normal head CT.  No abnormality seen to explain seizure.  She had a follow-up appointment with Shawnie Dapper, nurse practitioner for migraine management on 10/14/2019, at which time she reported that Melissa Escobar migraines had improved and Ubrelvy was helping.  She also reported that she had weaned off of Xanax with the help of rehab.   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  She is getting help through an outpatient re[facility] program [facility].  She is enrolled in it now.  Mom reports that patient would like to continue to be independent.  She is disappointed about the no driving.  I reiterated the driving rules for 6 months so long as patient remains seizure-free.  I also explained to them that she does not have to be on epilepsy medication.  She took Xanax 2 days ago for the last time, 1 mg per patient.  She does report intermittent stress and anxiety flareup and sometimes she does not sleep very well.  She does not drink any alcohol and reports that Melissa Escobar father had alcoholism.  She had a convulsive event on 11/04/2019.  This was witnessed by Melissa Escobar girlfriend.  Girlfriend sister  has epilepsy so the girlfriend felt confident that patient had a convulsive seizure event, patient reports that she chewed on Melissa Escobar tongue.  She had woken up and had some visual disturbance and that she felt she had a scotoma that she was able to track in Melissa Escobar visual field but then she blacked out.  She has no recollection of the event, she did not soil herself or wet herself but was sore afterwards.  She has no prior history of seizures or epilepsy.  She denies any neurological symptoms at this time.  The patient's allergies, current medications, family history, past medical history, past social history, past surgical history and problem list were reviewed and updated as appropriate.   Previously:   07/15/19: 24 year old right-handed woman with an underlying medical history of asthma, irritable bowel syndrome, anxiety, depression, and ADHD, who presented to the emergency room on 07/08/2019 with a history of recurrent migraines.  She has been on Maxalt.  She has had some visual symptoms and also at times has been noted to be confused and has had difficulty speaking at times.  Neurological exam in the emergency room was nonfocal.  She did not have any scans done.  She did not have any symptomatic treatment in the ER. She had a head CT without contrast and maxillofacial CT without contrast on 07/10/2014 and I reviewed the results: IMPRESSION: CT HEAD: No acute intracranial process ; normal noncontrast CT of the head.   CT MAXILLOFACIAL:  No facial fracture.   Mild LEFT periorbital soft tissue swelling without postseptal extent. She has had migraines since Melissa Escobar teenage years.  She does not typically have a visual aura.  She has associated nausea and vomiting at times, she has associated photophobia.  She has been not Maxalt per primary care PA which has been helpful, migraine frequency is generally about 2-3 times per month, typically around Melissa Escobar menstrual period.  Melissa Escobar mother is concerned about Melissa Escobar taking the  Maxalt for all this time, prior to that she was on Imitrex.  She has not had any other prescription medication, no preventatives.  She has a family history of migraines in Melissa Escobar mother.  Patient does not smoke any cigarettes but vapes nicotine.  She smokes marijuana very occasionally.  She does not drink any alcohol.  She limits Melissa Escobar caffeine but does drink on a regular basis some caffeine.  She feels like she sleeps well.  She Admits that she may skip meals at times.  She does not always hydrate well with water she admits.  Melissa Escobar Past Medical History Is Significant For: Past Medical History:  Diagnosis Date  . ADHD   . Anxiety   . Asthma   . Depression   . IBS (irritable bowel syndrome)   . Migraine   . Mood disorder (HCC)     Melissa Escobar Past Surgical History Is Significant For: Past Surgical History:  Procedure Laterality Date  . ROOT CANAL    . TYMPANOSTOMY TUBE PLACEMENT      Melissa Escobar Family History Is Significant For: Family History  Problem Relation Age of Onset  . Menstrual problems Mother   . Hypertension Mother   . Skin cancer Mother   . Breast cancer Maternal Grandmother   . Alcoholism Father   . Leukemia Maternal Grandfather   . Alcoholism Paternal Grandfather     Melissa Escobar Social History Is Significant For: Social History   Socioeconomic History  . Marital status: Single    Spouse name: Not on file  . Number of children: Not on file  . Years of education: Not on file  . Highest education level: Not on file  Occupational History  . Not on file  Tobacco Use  . Smoking status: Current Every Day Smoker    Types: E-cigarettes  . Smokeless tobacco: Never Used  Vaping Use  . Vaping Use: Every day  Substance and Sexual Activity  . Alcohol use: Not Currently  . Drug use: Yes    Types: Marijuana  . Sexual activity: Not on file  Other Topics Concern  . Not on file  Social History Narrative  . Not on file   Social Determinants of Health   Financial Resource Strain:   .  Difficulty of Paying Living Expenses:   Food Insecurity:   . Worried About Programme researcher, broadcasting/film/video in the Last Year:   . Barista in the Last Year:   Transportation Needs:   . Freight forwarder (Medical):   Marland Kitchen Lack of Transportation (Non-Medical):   Physical Activity:   . Days of Exercise per Week:   . Minutes of Exercise per Session:   Stress:   . Feeling of Stress :   Social Connections:   . Frequency of Communication with Friends and Family:   . Frequency of Social Gatherings with Friends and Family:   . Attends Religious Services:   . Active Member of Clubs or Organizations:   . Attends Banker Meetings:   .  Marital Status:     Melissa Escobar Allergies Are:  Allergies  Allergen Reactions  . Tamiflu [Oseltamivir Phosphate] Nausea And Vomiting  :   Melissa Escobar Current Medications Are:  Outpatient Encounter Medications as of 12/03/2019  Medication Sig  . albuterol (PROVENTIL HFA;VENTOLIN HFA) 108 (90 Base) MCG/ACT inhaler Inhale 1-2 puffs into the lungs every 6 (six) hours as needed for wheezing or shortness of breath.  . busPIRone (BUSPAR) 7.5 MG tablet Take 7.5 mg by mouth 2 (two) times daily as needed.   . cetirizine (ZYRTEC) 10 MG tablet Take 10 mg by mouth daily as needed for allergies.   Marland Kitchen Ubrogepant (UBRELVY) 50 MG TABS Take 50 mg by mouth as needed (may repeat once in 2 hours. no more than 2 pills in 24 h.).   No facility-administered encounter medications on file as of 12/03/2019.  :  Review of Systems:  Out of a complete 14 point review of systems, all are reviewed and negative with the exception of these symptoms as listed below: Review of Systems  Neurological:       Here for consult on seizure like activity- reports seizure like activity on 11/04/2019. Pt attributes this activity to withdrawals from xanax.      Objective:  Neurological Exam  Physical Exam Physical Examination:   Vitals:   12/03/19 0833  BP: 108/78  Pulse: 98   General Examination: The  patient is a very pleasant 24 y.o. female in no acute distress. She appears well-developed and well-nourished and well groomed.  Slightly anxious appearing.  HEENT: Normocephalic, atraumatic, pupils are equal, round and reactive to light and accommodation. Extraocular tracking is good without limitation to gaze excursion or nystagmus noted. Normal smooth pursuit is noted. Hearing is grossly intact. Face is symmetric with normal facial animation and normal facial sensation. Speech is clear with no dysarthria noted. There is no hypophonia. There is no lip, neck/head, jaw or voice tremor. Neck is supple with full range of passive and active motion. There are no carotid bruits on auscultation. Oropharynx exam reveals: mild mouth dryness, adequate dental hygiene. Tongue protrudes centrally and palate elevates symmetrically.  No obvious tongue laceration.  Chest: Clear to auscultation without wheezing, rhonchi or crackles noted.  Heart: S1+S2+0, regular and normal without murmurs, rubs or gallops noted.   Abdomen: Soft, non-tender and non-distended with normal bowel sounds appreciated on auscultation.  Extremities: There is no edema in the distal lower extremities bilaterally.  Skin: Warm and dry without trophic changes noted.  Musculoskeletal: exam reveals no obvious joint deformities, tenderness or joint swelling or erythema.   Neurologically:  Mental status: The patient is awake, alert and oriented in all 4 spheres. Melissa Escobar immediate and remote memory, attention, language skills and fund of knowledge are appropriate. There is no evidence of aphasia, agnosia, apraxia or anomia. Speech is clear with normal prosody and enunciation. Thought process is linear. Mood is normal and affect is normal.  Cranial nerves II - XII are as described above under HEENT exam.  Motor exam: Normal bulk, strength and tone is noted. There is no tremor. Fine motor skills and coordination: grossly intact.  Cerebellar  testing: No dysmetria or intention tremor. There is no truncal or gait ataxia.  Sensory exam: intact to light touch.  Gait, station and balance: She stands easily. No veering to one side is noted. No leaning to one side is noted. Posture is age-appropriate and stance is narrow based. Gait shows normal stride length and normal pace. No problems turning are  noted.   Assessment and Plan:   In summary, Melissa Escobar is a very pleasant 24 year old female with an underlying medical history of asthma, irritable bowel syndrome, anxiety, depression, migraine, benzodiazepine addition with recent relapse, and ADHD, who presents as a referral from the emergency room for evaluation of a recent seizure event.  This was a generalized convulsive event witnessed by Melissa Escobar girlfriend in early June.  She has not had a prior seizure, even in the context of stopping benzodiazepines.  She does have a tendency to not always eat right and hydrate well enough, likes to drink caffeine and has had intermittent stress and flareup and anxiety.  She reports that she is getting help for Melissa Escobar Xanax addiction.  She is advised that she may have had an isolated provoked seizure event.  Nevertheless, we will have to talk about seizure precautions and prevention.  She does not need to be on an antiepileptic medication from my end of things.  I would like to proceed with an EEG and a brain MRI with and without contrast to rule out a structural cause of Melissa Escobar seizure.  She is advised about seizure precautions including not driving for 6 months so long as she stays seizure-free.  I do believe she is okay to live in Melissa Escobar own apartment.  She is advised not to cook with large amounts of boiling water or oil and always use the back burner of the stove.  She is advised not to take any tub baths or swim alone.  She is furthermore advised not to be at heights or use any dangerous equipment or machinery.  She is doing reasonably well with regards to Melissa Escobar  migraines, she has been using Vanuatubrelvy and no longer takes triptan's.  She is advised to follow-up with Shawnie DapperAmy Lomax, nurse practitioner in December for a recheck on how she is doing.  As long as she has remained seizure-free she can resume driving after 6 months of Melissa Escobar seizure event.  I had a long discussion with the patient and Melissa Escobar mom about Melissa Escobar seizure and seizure risk and ongoing importance of staying well-hydrated, well rested, address anxiety and stress appropriately, keep plugged in with the rehab program.  She is obviously disappointed that she cannot resume driving at this moment but I explained my reasoning and importance of keeping Melissa Escobar safe and other safe especially on the road when she is driving.  She is understanding of this, mom is supportive.  I answered all the questions today and the patient and Melissa Escobar mother were in agreement with the plan.  We will call Melissa Escobar with any MRI and EEG results in the interim. I spent 40 minutes in total face-to-face time and in reviewing records during pre-charting, more than 50% of which was spent in counseling and coordination of care, reviewing test results, reviewing medications and treatment regimen and/or in discussing or reviewing the diagnosis of seizure, the prognosis and treatment options. Pertinent laboratory and imaging test results that were available during this visit with the patient were reviewed by me and considered in my medical decision making (see chart for details).

## 2019-12-08 ENCOUNTER — Telehealth: Payer: Self-pay | Admitting: Neurology

## 2019-12-08 NOTE — Telephone Encounter (Signed)
[facility] pending faxed notes

## 2019-12-09 NOTE — Telephone Encounter (Signed)
[facility] Auth: [id number] (exp. 12/08/19 to 02/08/20) patient is scheduled at [facility] for Sunday 12/20/19 to check in at the ER entrance for an out patient MRI at 12:30 Pm. I spoke to the patient and she is aware of this appt.

## 2019-12-20 ENCOUNTER — Other Ambulatory Visit: Payer: Self-pay

## 2019-12-20 ENCOUNTER — Ambulatory Visit (HOSPITAL_COMMUNITY)
Admission: RE | Admit: 2019-12-20 | Discharge: 2019-12-20 | Disposition: A | Discharge status: Home / Self Care | Payer: No Typology Code available for payment source | Source: Ambulatory Visit | Attending: Neurology | Admitting: Neurology

## 2019-12-20 DIAGNOSIS — R569 Unspecified convulsions: Secondary | ICD-10-CM | POA: Diagnosis not present

## 2019-12-20 DIAGNOSIS — G43009 Migraine without aura, not intractable, without status migrainosus: Secondary | ICD-10-CM | POA: Insufficient documentation

## 2019-12-20 MED ORDER — GADOBUTROL 1 MMOL/ML IV SOLN
6.0000 mL | Freq: Once | INTRAVENOUS | Status: AC | PRN
  Administered 2019-12-20: 6 mL via INTRAVENOUS

## 2019-12-21 ENCOUNTER — Other Ambulatory Visit: Payer: Self-pay

## 2019-12-21 ENCOUNTER — Ambulatory Visit: Payer: PRIVATE HEALTH INSURANCE | Admitting: Neurology

## 2019-12-21 ENCOUNTER — Telehealth: Payer: Self-pay

## 2019-12-21 DIAGNOSIS — G43009 Migraine without aura, not intractable, without status migrainosus: Secondary | ICD-10-CM

## 2019-12-21 DIAGNOSIS — R569 Unspecified convulsions: Secondary | ICD-10-CM

## 2019-12-21 NOTE — Telephone Encounter (Signed)
-----   Message from Asa Lente, MD sent at 12/21/2019 12:00 PM EDT ----- The MRI looks fine.  There were no abnormalities which could cause seizure.

## 2019-12-21 NOTE — Telephone Encounter (Signed)
Pt notified of results and verbalized understanding  

## 2019-12-31 NOTE — Procedures (Signed)
   HISTORY: 24 years old female, presented with seizure-like activity  TECHNIQUE:  This is a routine 16 channel EEG recording with one channel devoted to a limited EKG recording.  It was performed during wakefulness, drowsiness and asleep.  Hyperventilation and photic stimulation were performed as activating procedures.  There are minimum muscle and movement artifact noted.  Upon maximum arousal, posterior dominant waking rhythm consistent of rhythmic alpha range activity, with frequency of 11 hz. Activities are symmetric over the bilateral posterior derivations and attenuated with eye opening.  Hyperventilation produced mild/moderate buildup with higher amplitude and the slower activities noted.  Photic stimulation did not alter the tracing.  During EEG recording, patient developed drowsiness and no deeper stage of sleep was achieved During EEG recording, there was no epileptiform discharge noted.  EKG demonstrate sinus rhythm, with heart rate of 76 bpm  CONCLUSION: This is a  normal awake EEG.  There is no electrodiagnostic evidence of epileptiform discharge.  Levert Feinstein, M.D. Ph.D.  [facility] [address] [place], [place] [address] Phone: [phone] Fax:      [phone]

## 2020-01-14 ENCOUNTER — Other Ambulatory Visit: Payer: Self-pay | Admitting: Neurology

## 2020-01-14 ENCOUNTER — Telehealth: Payer: Self-pay | Admitting: *Deleted

## 2020-01-14 NOTE — Telephone Encounter (Signed)
PA denied but pt can continue to use copay card.

## 2020-01-14 NOTE — Telephone Encounter (Signed)
Submitted PA Ubrelvy on [facility]. Key: [id number]. Waiting on determination from [employer].

## 2020-04-29 ENCOUNTER — Other Ambulatory Visit: Payer: Self-pay

## 2020-04-29 ENCOUNTER — Ambulatory Visit (HOSPITAL_COMMUNITY)
Admission: EM | Admit: 2020-04-29 | Discharge: 2020-04-29 | Disposition: A | Discharge status: Home / Self Care | Payer: No Typology Code available for payment source

## 2020-05-04 ENCOUNTER — Ambulatory Visit: Payer: PRIVATE HEALTH INSURANCE | Admitting: Family Medicine

## 2020-05-04 ENCOUNTER — Encounter: Payer: Self-pay | Admitting: Family Medicine

## 2020-05-04 ENCOUNTER — Other Ambulatory Visit: Payer: Self-pay

## 2020-05-04 VITALS — BP 91/55 | HR 80 | Ht 69.0 in | Wt 131.0 lb

## 2020-05-04 DIAGNOSIS — G43009 Migraine without aura, not intractable, without status migrainosus: Secondary | ICD-10-CM

## 2020-05-04 DIAGNOSIS — R569 Unspecified convulsions: Secondary | ICD-10-CM

## 2020-05-04 NOTE — Patient Instructions (Addendum)
Below is our plan:  We will continue Ubrelvy for abortive therapy. I do not have any further driving restrictions as long as you remain seizure free. Please use caution with driving.   Please make sure you are staying well hydrated. I recommend 50-60 ounces daily. Well balanced diet and regular exercise encouraged.   Please continue follow up with care team as directed.   Follow up in 1 year   You may receive a survey regarding today's visit. I encourage you to leave honest feed back as I do use this information to improve patient care. Thank you for seeing me today!    According to [place] law, you can not drive unless you are seizure / syncope free for at least 6 months and under physician's care.  Please maintain precautions. Do not participate in activities where a loss of awareness could harm you or someone else. No swimming alone, no tub bathing, no hot tubs, no driving, no operating motorized vehicles (cars, ATVs, motocycles, etc), lawnmowers, power tools or firearms. No standing at heights, such as rooftops, ladders or stairs. Avoid hot objects such as stoves, heaters, open fires. Wear a helmet when riding a bicycle, scooter, skateboard, etc. and avoid areas of traffic. Set your water heater to 120 degrees or less.     Seizure, Adult A seizure is a sudden burst of abnormal electrical activity in the brain. Seizures usually last from 30 seconds to 2 minutes. They can cause many different symptoms. Usually, seizures are not harmful unless they last a long time. What are the causes? Common causes of this condition include:  Fever or infection.  Conditions that affect the brain, such as: ? A brain abnormality that you were born with. ? A brain or head injury. ? Bleeding in the brain. ? A tumor. ? Stroke. ? Brain disorders such as autism or cerebral palsy.  Low blood sugar.  Conditions that are passed from parent to child (are inherited).  Problems with substances, such  as: ? Having a reaction to a drug or a medicine. ? Suddenly stopping the use of a substance (withdrawal). In some cases, the cause may not be known. A person who has repeated seizures over time without a clear cause has a condition called epilepsy. What increases the risk? You are more likely to get this condition if you have:  A family history of epilepsy.  Had a seizure in the past.  A brain disorder.  A history of head injury, lack of oxygen at birth, or strokes. What are the signs or symptoms? There are many types of seizures. The symptoms vary depending on the type of seizure you have. Examples of symptoms during a seizure include:  Shaking (convulsions).  Stiffness in the body.  Passing out (losing consciousness).  Head nodding.  Staring.  Not responding to sound or touch.  Loss of bladder control and bowel control. Some people have symptoms right before and right after a seizure happens. Symptoms before a seizure may include:  Fear.  Worry (anxiety).  Feeling like you may vomit (nauseous).  Feeling like the room is spinning (vertigo).  Feeling like you saw or heard something before (dj vu).  Odd tastes or smells.  Changes in how you see. You may see flashing lights or spots. Symptoms after a seizure happens can include:  Confusion.  Sleepiness.  Headache.  Weakness on one side of the body. How is this treated? Most seizures will stop on their own in under 5 minutes. In  these cases, no treatment is needed. Seizures that last longer than 5 minutes will usually need treatment. Treatment can include:  Medicines given through an IV tube.  Avoiding things that are known to cause your seizures. These can include medicines that you take for another condition.  Medicines to treat epilepsy.  Surgery to stop the seizures. This may be needed if medicines do not help. Follow these instructions at home: Medicines  Take over-the-counter and prescription  medicines only as told by your doctor.  Do not eat or drink anything that may keep your medicine from working, such as alcohol. Activity  Do not do any activities that would be dangerous if you had another seizure, like driving or swimming. Wait until your doctor says it is safe for you to do them.  If you live in the [place] ask your local DMV (department of motor vehicles) when you can drive.  Get plenty of rest. Teaching others Teach friends and family what to do when you have a seizure. They should:  Lay you on the ground.  Protect your head and body.  Loosen any tight clothing around your neck.  Turn you on your side.  Not hold you down.  Not put anything into your mouth.  Know whether or not you need emergency care.  Stay with you until you are better.  General instructions  Contact your doctor each time you have a seizure.  Avoid anything that gives you seizures.  Keep a seizure diary. Write down: ? What you think caused each seizure. ? What you remember about each seizure.  Keep all follow-up visits as told by your doctor. This is important. Contact a doctor if:  You have another seizure.  You have seizures more often.  There is any change in what happens during your seizures.  You keep having seizures with treatment.  You have symptoms of being sick or having an infection. Get help right away if:  You have a seizure that: ? Lasts longer than 5 minutes. ? Is different than seizures you had before. ? Makes it harder to breathe. ? Happens after you hurt your head.  You have any of these symptoms after a seizure: ? Not being able to speak. ? Not being able to use a part of your body. ? Confusion. ? A bad headache.  You have two or more seizures in a row.  You do not wake up right after a seizure.  You get hurt during a seizure. These symptoms may be an emergency. Do not wait to see if the symptoms will go away. Get medical help right away. Call  your local emergency services (911 in the [place] Do not drive yourself to the hospital. Summary  Seizures usually last from 30 seconds to 2 minutes. Usually, they are not harmful unless they last a long time.  Do not eat or drink anything that may keep your medicine from working, such as alcohol.  Teach friends and family what to do when you have a seizure.  Contact your doctor each time you have a seizure. This information is not intended to replace advice given to you by your health care provider. Make sure you discuss any questions you have with your health care provider. Document Revised: 08/01/2018 Document Reviewed: 08/01/2018 Elsevier Patient Education  2020 [employer].   Migraine Headache A migraine headache is a very strong throbbing pain on one side or both sides of your head. This type of headache can also cause other  symptoms. It can last from 4 hours to 3 days. Talk with your doctor about what things may bring on (trigger) this condition. What are the causes? The exact cause of this condition is not known. This condition may be triggered or caused by:  Drinking alcohol.  Smoking.  Taking medicines, such as: ? Medicine used to treat chest pain (nitroglycerin). ? Birth control pills. ? Estrogen. ? Some blood pressure medicines.  Eating or drinking certain products.  Doing physical activity. Other things that may trigger a migraine headache include:  Having a menstrual period.  Pregnancy.  Hunger.  Stress.  Not getting enough sleep or getting too much sleep.  Weather changes.  Tiredness (fatigue). What increases the risk?  Being 93-78 years old.  Being female.  Having a family history of migraine headaches.  Being Caucasian.  Having depression or anxiety.  Being very overweight. What are the signs or symptoms?  A throbbing pain. This pain may: ? Happen in any area of the head, such as on one side or both sides. ? Make it hard to do daily  activities. ? Get worse with physical activity. ? Get worse around bright lights or loud noises.  Other symptoms may include: ? Feeling sick to your stomach (nauseous). ? Vomiting. ? Dizziness. ? Being sensitive to bright lights, loud noises, or smells.  Before you get a migraine headache, you may get warning signs (an aura). An aura may include: ? Seeing flashing lights or having blind spots. ? Seeing bright spots, halos, or zigzag lines. ? Having tunnel vision or blurred vision. ? Having numbness or a tingling feeling. ? Having trouble talking. ? Having weak muscles.  Some people have symptoms after a migraine headache (postdromal phase), such as: ? Tiredness. ? Trouble thinking (concentrating). How is this treated?  Taking medicines that: ? Relieve pain. ? Relieve the feeling of being sick to your stomach. ? Prevent migraine headaches.  Treatment may also include: ? Having acupuncture. ? Avoiding foods that bring on migraine headaches. ? Learning ways to control your body functions (biofeedback). ? Therapy to help you know and deal with negative thoughts (cognitive behavioral therapy). Follow these instructions at home: Medicines  Take over-the-counter and prescription medicines only as told by your doctor.  Ask your doctor if the medicine prescribed to you: ? Requires you to avoid driving or using heavy machinery. ? Can cause trouble pooping (constipation). You may need to take these steps to prevent or treat trouble pooping:  Drink enough fluid to keep your pee (urine) pale yellow.  Take over-the-counter or prescription medicines.  Eat foods that are high in fiber. These include beans, whole grains, and fresh fruits and vegetables.  Limit foods that are high in fat and sugar. These include fried or sweet foods. Lifestyle  Do not drink alcohol.  Do not use any products that contain nicotine or tobacco, such as cigarettes, e-cigarettes, and chewing tobacco. If  you need help quitting, ask your doctor.  Get at least 8 hours of sleep every night.  Limit and deal with stress. General instructions      Keep a journal to find out what may bring on your migraine headaches. For example, write down: ? What you eat and drink. ? How much sleep you get. ? Any change in what you eat or drink. ? Any change in your medicines.  If you have a migraine headache: ? Avoid things that make your symptoms worse, such as bright lights. ? It may help  to lie down in a dark, quiet room. ? Do not drive or use heavy machinery. ? Ask your doctor what activities are safe for you.  Keep all follow-up visits as told by your doctor. This is important. Contact a doctor if:  You get a migraine headache that is different or worse than others you have had.  You have more than 15 headache days in one month. Get help right away if:  Your migraine headache gets very bad.  Your migraine headache lasts longer than 72 hours.  You have a fever.  You have a stiff neck.  You have trouble seeing.  Your muscles feel weak or like you cannot control them.  You start to lose your balance a lot.  You start to have trouble walking.  You pass out (faint).  You have a seizure. Summary  A migraine headache is a very strong throbbing pain on one side or both sides of your head. These headaches can also cause other symptoms.  This condition may be treated with medicines and changes to your lifestyle.  Keep a journal to find out what may bring on your migraine headaches.  Contact a doctor if you get a migraine headache that is different or worse than others you have had.  Contact your doctor if you have more than 15 headache days in a month. This information is not intended to replace advice given to you by your health care provider. Make sure you discuss any questions you have with your health care provider. Document Revised: 09/05/2018 Document Reviewed:  06/26/2018 Elsevier Patient Education  2020 [employer].

## 2020-05-04 NOTE — Progress Notes (Addendum)
Chief Complaint  Patient presents with  . Follow-up    rm 1  . Migraine    Pt said the Bernita Raisin has been helping with her migraines and they are not as frequent.  . Seizures    pt said she would like to be cleared to drive.     HISTORY OF PRESENT ILLNESS: Today 05/04/20  Melissa Escobar is a 24 y.o. 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  She continues to participate in NA and denies recent use of Xanax or other street drugs.   Migraines are well managed with Ubrelvy for abortive therapy. She uses medication about 2-3 times per month on average but has used it up to 4-6 times. Headaches are worse with skipped meals or menstrual cycle.   She denies any concerns of seizure activity since last being seen. She wishes to resume driving privileges.     HISTORY (copied from previous note)  Melissa Escobar is a 24 year old right-handed woman with an underlying medical history of mood disorder, irritable bowel syndrome, asthma, ADHD and migraine headaches, who presents for evaluation of a new problem, concern for recent seizure activity.  The patient is accompanied by her mother today.  The patient was evaluated in the emergency room for a seizure-like event that day on 11/04/2019.  I reviewed the emergency room records.  UDS was positive for benzodiazepines and THC.  She has a prior history of benzodiazepine abuse but denied any recent overuse or withdrawal symptoms. I have previously seen her for migraine evaluation. She had a head CT without contrast on 10/27/2019 and I reviewed the results: IMPRESSION: Normal head CT. No abnormality seen to explain seizure.  She had a follow-up appointment with Shawnie Dapper, nurse practitioner for migraine management on 10/14/2019, at which time she reported that her migraines had improved and Ubrelvy was helping.  She also reported that she had weaned off of Xanax with the help of  rehab.  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  She is getting help through [facility] program [facility].  She is enrolled in it now.  Mom reports that patient would like to continue to be independent.  She is disappointed about the no driving.  I reiterated the driving rules for 6 months so long as patient remains seizure-free.  I also explained to them that she does not have to be on epilepsy medication.  She took Xanax 2 days ago for the last time, 1 mg per patient.  She does report intermittent stress and anxiety flareup and sometimes she does not sleep very well.  She does not drink any alcohol and reports that her father had alcoholism.  She had a convulsive event on 11/04/2019.  This was witnessed by her girlfriend.  Girlfriend sister has epilepsy so the girlfriend felt confident that patient had a convulsive seizure event, patient reports that she chewed on her tongue.  She had woken up and had some visual disturbance and that she felt she had a scotoma that she was able to track in her visual field but then she blacked out.  She has no recollection of the event, she did not soil herself or wet herself but was sore afterwards.  She has no prior history of seizures or epilepsy.  She denies any neurological symptoms at this time.    REVIEW OF SYSTEMS: Out of  a complete 14 system review of symptoms, the patient complains only of the following symptoms, none and all other reviewed systems are negative.   ALLERGIES: Allergies  Allergen Reactions  . Tamiflu [Oseltamivir Phosphate] Nausea And Vomiting     HOME MEDICATIONS: Outpatient Medications Prior to Visit  Medication Sig Dispense Refill  . albuterol (PROVENTIL HFA;VENTOLIN HFA) 108 (90 Base) MCG/ACT inhaler Inhale 1-2 puffs into the lungs every 6 (six) hours as needed for wheezing or shortness of breath.    . busPIRone (BUSPAR) 7.5 MG tablet Take 7.5 mg by mouth 2 (two) times daily  as needed.     . cetirizine (ZYRTEC) 10 MG tablet Take 10 mg by mouth daily as needed for allergies.     . UBRELVY 50 MG TABS TAKE 1 TABLET BY MOUTH AS NEEDED. MAY REPEAT ONCE IN 2 HOURS; NO MORE THAN 2 TABLETS IN  24 HOURS. 10 tablet 3   No facility-administered medications prior to visit.     PAST MEDICAL HISTORY: Past Medical History:  Diagnosis Date  . ADHD   . Anxiety   . Asthma   . Depression   . IBS (irritable bowel syndrome)   . Migraine   . Mood disorder (HCC)      PAST SURGICAL HISTORY: Past Surgical History:  Procedure Laterality Date  . ROOT CANAL    . TYMPANOSTOMY TUBE PLACEMENT       FAMILY HISTORY: Family History  Problem Relation Age of Onset  . Menstrual problems Mother   . Hypertension Mother   . Skin cancer Mother   . Breast cancer Maternal Grandmother   . Alcoholism Father   . Leukemia Maternal Grandfather   . Alcoholism Paternal Grandfather      SOCIAL HISTORY: Social History   Socioeconomic History  . Marital status: Single    Spouse name: Not on file  . Number of children: Not on file  . Years of education: Not on file  . Highest education level: Not on file  Occupational History  . Not on file  Tobacco Use  . Smoking status: Current Every Day Smoker    Types: E-cigarettes  . Smokeless tobacco: Never Used  Vaping Use  . Vaping Use: Every day  Substance and Sexual Activity  . Alcohol use: Not Currently  . Drug use: Yes    Types: Marijuana  . Sexual activity: Not on file  Other Topics Concern  . Not on file  Social History Narrative  . Not on file   Social Determinants of Health   Financial Resource Strain:   . Difficulty of Paying Living Expenses: Not on file  Food Insecurity:   . Worried About Programme researcher, broadcasting/film/video in the Last Year: Not on file  . Ran Out of Food in the Last Year: Not on file  Transportation Needs:   . Lack of Transportation (Medical): Not on file  . Lack of Transportation (Non-Medical): Not on file   Physical Activity:   . Days of Exercise per Week: Not on file  . Minutes of Exercise per Session: Not on file  Stress:   . Feeling of Stress : Not on file  Social Connections:   . Frequency of Communication with Friends and Family: Not on file  . Frequency of Social Gatherings with Friends and Family: Not on file  . Attends Religious Services: Not on file  . Active Member of Clubs or Organizations: Not on file  . Attends Banker Meetings: Not on file  .  Marital Status: Not on file  Intimate Partner Violence:   . Fear of Current or Ex-Partner: Not on file  . Emotionally Abused: Not on file  . Physically Abused: Not on file  . Sexually Abused: Not on file      PHYSICAL EXAM  Vitals:   05/04/20 1020  BP: (!) 91/55  Pulse: 80  Weight: 131 lb (59.4 kg)  Height: 5\' 9"  (1.753 m)   Body mass index is 19.35 kg/m.   Generalized: Well developed, in no acute distress  Cardiology: normal rate and rhythm, no murmur auscultated  Respiratory: clear to auscultation bilaterally    Neurological examination  Mentation: Alert oriented to time, place, history taking. Follows all commands speech and language fluent Cranial nerve II-XII: Pupils were equal round reactive to light. Extraocular movements were full, visual field were full . Motor: The motor testing reveals 5 over 5 strength of all 4 extremities. Good symmetric motor tone is noted throughout.  Gait and station: Gait is normal.     DIAGNOSTIC DATA (LABS, IMAGING, TESTING) - I reviewed patient records, labs, notes, testing and imaging myself where available.  Lab Results  Component Value Date   WBC 5.4 11/04/2019   HGB 12.6 11/04/2019   HCT 38.5 11/04/2019   MCV 97.5 11/04/2019   PLT 190 11/04/2019      Component Value Date/Time   NA 139 11/04/2019 0951   K 3.9 11/04/2019 0951   CL 106 11/04/2019 0951   CO2 24 11/04/2019 0951   GLUCOSE 97 11/04/2019 0951   BUN 12 11/04/2019 0951   CREATININE 0.79  11/04/2019 0951   CALCIUM 9.1 11/04/2019 0951   PROT 6.8 11/04/2019 0951   ALBUMIN 4.1 11/04/2019 0951   AST 18 11/04/2019 0951   ALT 14 11/04/2019 0951   ALKPHOS 35 (L) 11/04/2019 0951   BILITOT 0.6 11/04/2019 0951   GFRNONAA >60 11/04/2019 0951   GFRAA >60 11/04/2019 0951   No results found for: CHOL, HDL, LDLCALC, LDLDIRECT, TRIG, CHOLHDL No results found for: 01/04/2020 No results found for: VITAMINB12 No results found for: TSH    ASSESSMENT AND PLAN  24 y.o. year old female  has a past medical history of ADHD, Anxiety, Asthma, Depression, IBS (irritable bowel syndrome), Migraine, and Mood disorder (HCC). here with   Single seizure (HCC)  Migraine without aura and without status migrainosus, not intractable  23 reports that she is doing very well.  Danella Deis works for migraine abortive therapy.  We will continue current treatment plan.  She denies any recent seizure activity.  Only 1 seizure reported in June.  MRI and EEG were normal.  Seizure precautions reviewed.  No further driving restrictions at this time.  Healthy lifestyle habits encouraged.  She was commended for remaining drug-free.  She will continue close follow-up with her primary care provider.  She will follow-up with me in 1 year, sooner if needed.  She verbalizes understanding and agreement with this plan.  I spent 20 minutes of face-to-face and non-face-to-face time with patient.  This included previsit chart review, lab review, study review, order entry, electronic health record documentation, patient education.    July, MSN, FNP-C 05/04/2020, 10:36 AM  [facility] [address], [address] [place], [place] [phone]  I reviewed the above note and documentation by the Nurse Practitioner and agree with the history, exam, assessment and plan as outlined above. I was available for consultation. [phone], MD, PhD [facility])

## 2020-07-05 ENCOUNTER — Other Ambulatory Visit: Payer: Self-pay | Admitting: Neurology

## 2020-07-05 ENCOUNTER — Other Ambulatory Visit: Payer: Self-pay

## 2020-07-05 MED ORDER — UBRELVY 50 MG PO TABS: ORAL_TABLET | ORAL | 3 refills | Status: DC

## 2020-10-13 NOTE — Patient Instructions (Signed)
Below is our plan:  We will continue Ubrelvy as needed for migraine abortion.   Please make sure you are staying well hydrated. I recommend 50-60 ounces daily. Well balanced diet and regular exercise encouraged. Consistent sleep schedule with 6-8 hours recommended.   Please continue follow up with care team as directed.   Follow up with me in 1 year. May follow up as needed if PCP will refill Ubrelvy for you.   You may receive a survey regarding today's visit. I encourage you to leave honest feed back as I do use this information to improve patient care. Thank you for seeing me today!       Migraine Headache A migraine headache is a very strong throbbing pain on one side or both sides of your head. This type of headache can also cause other symptoms. It can last from 4 hours to 3 days. Talk with your doctor about what things may bring on (trigger) this condition. What are the causes? The exact cause of this condition is not known. This condition may be triggered or caused by:  Drinking alcohol.  Smoking.  Taking medicines, such as: ? Medicine used to treat chest pain (nitroglycerin). ? Birth control pills. ? Estrogen. ? Some blood pressure medicines.  Eating or drinking certain products.  Doing physical activity. Other things that may trigger a migraine headache include:  Having a menstrual period.  Pregnancy.  Hunger.  Stress.  Not getting enough sleep or getting too much sleep.  Weather changes.  Tiredness (fatigue). What increases the risk?  Being 62-4 years old.  Being female.  Having a family history of migraine headaches.  Being Caucasian.  Having depression or anxiety.  Being very overweight. What are the signs or symptoms?  A throbbing pain. This pain may: ? Happen in any area of the head, such as on one side or both sides. ? Make it hard to do daily activities. ? Get worse with physical activity. ? Get worse around bright lights or loud  noises.  Other symptoms may include: ? Feeling sick to your stomach (nauseous). ? Vomiting. ? Dizziness. ? Being sensitive to bright lights, loud noises, or smells.  Before you get a migraine headache, you may get warning signs (an aura). An aura may include: ? Seeing flashing lights or having blind spots. ? Seeing bright spots, halos, or zigzag lines. ? Having tunnel vision or blurred vision. ? Having numbness or a tingling feeling. ? Having trouble talking. ? Having weak muscles.  Some people have symptoms after a migraine headache (postdromal phase), such as: ? Tiredness. ? Trouble thinking (concentrating). How is this treated?  Taking medicines that: ? Relieve pain. ? Relieve the feeling of being sick to your stomach. ? Prevent migraine headaches.  Treatment may also include: ? Having acupuncture. ? Avoiding foods that bring on migraine headaches. ? Learning ways to control your body functions (biofeedback). ? Therapy to help you know and deal with negative thoughts (cognitive behavioral therapy). Follow these instructions at home: Medicines  Take over-the-counter and prescription medicines only as told by your doctor.  Ask your doctor if the medicine prescribed to you: ? Requires you to avoid driving or using heavy machinery. ? Can cause trouble pooping (constipation). You may need to take these steps to prevent or treat trouble pooping:  Drink enough fluid to keep your pee (urine) pale yellow.  Take over-the-counter or prescription medicines.  Eat foods that are high in fiber. These include beans, whole grains, and  fresh fruits and vegetables.  Limit foods that are high in fat and sugar. These include fried or sweet foods. Lifestyle  Do not drink alcohol.  Do not use any products that contain nicotine or tobacco, such as cigarettes, e-cigarettes, and chewing tobacco. If you need help quitting, ask your doctor.  Get at least 8 hours of sleep every  night.  Limit and deal with stress. General instructions  Keep a journal to find out what may bring on your migraine headaches. For example, write down: ? What you eat and drink. ? How much sleep you get. ? Any change in what you eat or drink. ? Any change in your medicines.  If you have a migraine headache: ? Avoid things that make your symptoms worse, such as bright lights. ? It may help to lie down in a dark, quiet room. ? Do not drive or use heavy machinery. ? Ask your doctor what activities are safe for you.  Keep all follow-up visits as told by your doctor. This is important.      Contact a doctor if:  You get a migraine headache that is different or worse than others you have had.  You have more than 15 headache days in one month. Get help right away if:  Your migraine headache gets very bad.  Your migraine headache lasts longer than 72 hours.  You have a fever.  You have a stiff neck.  You have trouble seeing.  Your muscles feel weak or like you cannot control them.  You start to lose your balance a lot.  You start to have trouble walking.  You pass out (faint).  You have a seizure. Summary  A migraine headache is a very strong throbbing pain on one side or both sides of your head. These headaches can also cause other symptoms.  This condition may be treated with medicines and changes to your lifestyle.  Keep a journal to find out what may bring on your migraine headaches.  Contact a doctor if you get a migraine headache that is different or worse than others you have had.  Contact your doctor if you have more than 15 headache days in a month. This information is not intended to replace advice given to you by your health care provider. Make sure you discuss any questions you have with your health care provider. Document Revised: 09/05/2018 Document Reviewed: 06/26/2018 Elsevier Patient Education  2021 Elsevier Inc.   Seizure, Adult A seizure is  a sudden burst of abnormal electrical and chemical activity in the brain. Seizures usually last from 30 seconds to 2 minutes.  What are the causes? Common causes of this condition include:  Fever or infection.  Problems that affect the brain. These may include: ? A brain or head injury. ? Bleeding in the brain. ? A brain tumor.  Low levels of blood sugar or salt.  Kidney problems or liver problems.  Conditions that are passed from parent to child (are inherited).  Problems with a substance, such as: ? Having a reaction to a drug or a medicine. ? Stopping the use of a substance all of a sudden (withdrawal).  A stroke.  Disorders that affect how you develop. Sometimes, the cause may not be known.  What increases the risk?  Having someone in your family who has epilepsy. In this condition, seizures happen again and again over time. They have no clear cause.  Having had a tonic-clonic seizure before. This type of seizure causes you  to: ? Tighten the muscles of the whole body. ? Lose consciousness.  Having had a head injury or strokes before.  Having had a lack of oxygen at birth. What are the signs or symptoms? There are many types of seizures. The symptoms vary depending on the type of seizure you have. Symptoms during a seizure  Shaking that you cannot control (convulsions) with fast, jerky movements of muscles.  Stiffness of the body.  Breathing problems.  Feeling mixed up (confused).  Staring or not responding to sound or touch.  Head nodding.  Eyes that blink, flutter, or move fast.  Drooling, grunting, or making clicking sounds with your mouth  Losing control of when you pee or poop. Symptoms before a seizure  Feeling afraid, nervous, or worried.  Feeling like you may vomit.  Feeling like: ? You are moving when you are not. ? Things around you are moving when they are not.  Feeling like you saw or heard something before (dj vu).  Odd tastes or  smells.  Changes in how you see. You may see flashing lights or spots. Symptoms after a seizure  Feeling confused.  Feeling sleepy.  Headache.  Sore muscles. How is this treated? If your seizure stops on its own, you will not need treatment. If your seizure lasts longer than 5 minutes, you will normally need treatment. Treatment may include:  Medicines given through an IV tube.  Avoiding things, such as medicines, that are known to cause your seizures.  Medicines to prevent seizures.  A device to prevent or control seizures.  Surgery.  A diet low in carbohydrates and high in fat (ketogenic diet). Follow these instructions at home: Medicines  Take over-the-counter and prescription medicines only as told by your doctor.  Avoid foods or drinks that may keep your medicine from working, such as alcohol. Activity  Follow instructions about driving, swimming, or doing things that would be dangerous if you had another seizure. Wait until your doctor says it is safe for you to do these things.  If you live in the [place] ask your local department of motor vehicles when you can drive.  Get a lot of rest. Teaching others  Teach friends and family what to do when you have a seizure. They should: ? Help you get down to the ground. ? Protect your head and body. ? Loosen any clothing around your neck. ? Turn you on your side. ? Know whether or not you need emergency care. ? Stay with you until you are better.  Also, tell them what not to do if you have a seizure. Tell them: ? They should not hold you down. ? They should not put anything in your mouth.   General instructions  Avoid anything that gives you seizures.  Keep a seizure diary. Write down: ? What you remember about each seizure. ? What you think caused each seizure.  Keep all follow-up visits. Contact a doctor if:  You have another seizure or seizures. Call the doctor each time you have a seizure.  The pattern  of your seizures changes.  You keep having seizures with treatment.  You have symptoms of being sick or having an infection.  You are not able to take your medicine. Get help right away if:  You have any of these problems: ? A seizure that lasts longer than 5 minutes. ? Many seizures in a row and you do not feel better between seizures. ? A seizure that makes it harder to  breathe. ? A seizure and you can no longer speak or use part of your body.  You do not wake up right after a seizure.  You get hurt during a seizure.  You feel confused or have pain right after a seizure. These symptoms may be an emergency. Get help right away. Call your local emergency services (911 in the [place]  Do not wait to see if the symptoms will go away.  Do not drive yourself to the hospital. Summary  A seizure is a sudden burst of abnormal electrical and chemical activity in the brain. Seizures normally last from 30 seconds to 2 minutes.  Causes of seizures include illness, injury to the head, low levels of blood sugar or salt, and certain conditions.  Most seizures will stop on their own in less than 5 minutes. Seizures that last longer than 5 minutes are a medical emergency and need treatment right away.  Many medicines are used to treat seizures. Take over-the-counter and prescription medicines only as told by your doctor. This information is not intended to replace advice given to you by your health care provider. Make sure you discuss any questions you have with your health care provider. Document Revised: 11/20/2019 Document Reviewed: 11/20/2019 Elsevier Patient Education  2021 [employer]

## 2020-10-13 NOTE — Progress Notes (Addendum)
Chief Complaint  Patient presents with  . Follow-up    RM 2, alone, Migraines- pt states she has fewer migraines and Bernita Raisin has been working great and instantly gets relief. Reports eating better, drinking more water, and life has become more stable. Reports no new SX SZ-reports no SX like activity     HISTORY OF PRESENT ILLNESS: 10/17/20 ALL: Melissa Escobar returns for follow up for previous seizure in setting of benzodiazepine withdrawal and migraines. She continues Ubrelvy for abortive therapy. She is doing very well. No seizure activity. She is doing well with sobriety. She is working at [employer] and is being promoted. She has been working more hours. She quit eating as much junk food and is eating healthier. She is drinking more water. She has about 2-3 headache days per month, usually around menstrual cycle. Bernita Raisin works very well. She has lost about 10 pounds over the past 6 months but relates this to lifestyle changes.    05/04/2020 ALL:  Melissa Escobar is a 25 y.o. 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  She continues to participate in NA and denies recent use of Xanax or other street drugs.   Migraines are well managed with Ubrelvy for abortive therapy. She uses medication about 2-3 times per month on average but has used it up to 4-6 times. Headaches are worse with skipped meals or menstrual cycle.   She denies any concerns of seizure activity since last being seen. She wishes to resume driving privileges.     HISTORY (copied from previous note)  Melissa Escobar is a 25 year old right-handed woman with an underlying medical history of mood disorder, irritable bowel syndrome, asthma, ADHD and migraine headaches, who presents for evaluation of a new problem, concern for recent seizure activity.  The patient is accompanied by her mother today.  The patient was evaluated in the emergency room for a  seizure-like event that day on 11/04/2019.  I reviewed the emergency room records.  UDS was positive for benzodiazepines and THC.  She has a prior history of benzodiazepine abuse but denied any recent overuse or withdrawal symptoms. I have previously seen her for migraine evaluation. She had a head CT without contrast on 10/27/2019 and I reviewed the results: IMPRESSION: Normal head CT. No abnormality seen to explain seizure.  She had a follow-up appointment with Shawnie Dapper, nurse practitioner for migraine management on 10/14/2019, at which time she reported that her migraines had improved and Ubrelvy was helping.  She also reported that she had weaned off of Xanax with the help of rehab.  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  She is getting help through an [facility] program [facility].  She is enrolled in it now.  Mom reports that patient would like to continue to be independent.  She is disappointed about the no driving.  I reiterated the driving rules for 6 months so long as patient remains seizure-free.  I also explained to them that she does not have to be on epilepsy medication.  She took Xanax 2 days ago for the last time, 1 mg per patient.  She does report intermittent stress and anxiety flareup and sometimes she does not sleep very well.  She does not drink any alcohol and reports that her father had alcoholism.  She had a convulsive event on 11/04/2019.  This was witnessed by  her girlfriend.  Girlfriend sister has epilepsy so the girlfriend felt confident that patient had a convulsive seizure event, patient reports that she chewed on her tongue.  She had woken up and had some visual disturbance and that she felt she had a scotoma that she was able to track in her visual field but then she blacked out.  She has no recollection of the event, she did not soil herself or wet herself but was sore afterwards.  She has no prior history of seizures or  epilepsy.  She denies any neurological symptoms at this time.    REVIEW OF SYSTEMS: Out of a complete 14 system review of symptoms, the patient complains only of the following symptoms, none and all other reviewed systems are negative.   ALLERGIES: Allergies  Allergen Reactions  . Tamiflu [Oseltamivir Phosphate] Nausea And Vomiting     HOME MEDICATIONS: Outpatient Medications Prior to Visit  Medication Sig Dispense Refill  . albuterol (PROVENTIL HFA;VENTOLIN HFA) 108 (90 Base) MCG/ACT inhaler Inhale 1-2 puffs into the lungs every 6 (six) hours as needed for wheezing or shortness of breath.    . busPIRone (BUSPAR) 7.5 MG tablet Take 7.5 mg by mouth 2 (two) times daily as needed.     . cetirizine (ZYRTEC) 10 MG tablet Take 10 mg by mouth daily as needed for allergies.     Marland Kitchen Ubrogepant (UBRELVY) 50 MG TABS TAKE 1 TABLET BY MOUTH AS NEEDED. MAY REPEAT ONCE IN 2 HOURS; NO MORE THAN 2 TABLETS IN  24 HOURS. 10 tablet 3   No facility-administered medications prior to visit.     PAST MEDICAL HISTORY: Past Medical History:  Diagnosis Date  . ADHD   . Anxiety   . Asthma   . Depression   . IBS (irritable bowel syndrome)   . Migraine   . Mood disorder (HCC)      PAST SURGICAL HISTORY: Past Surgical History:  Procedure Laterality Date  . ROOT CANAL    . TYMPANOSTOMY TUBE PLACEMENT       FAMILY HISTORY: Family History  Problem Relation Age of Onset  . Menstrual problems Mother   . Hypertension Mother   . Skin cancer Mother   . Breast cancer Maternal Grandmother   . Alcoholism Father   . Leukemia Maternal Grandfather   . Alcoholism Paternal Grandfather      SOCIAL HISTORY: Social History   Socioeconomic History  . Marital status: Single    Spouse name: Not on file  . Number of children: Not on file  . Years of education: Not on file  . Highest education level: Not on file  Occupational History  . Not on file  Tobacco Use  . Smoking status: Current Every Day  Smoker    Types: E-cigarettes  . Smokeless tobacco: Never Used  Vaping Use  . Vaping Use: Every day  Substance and Sexual Activity  . Alcohol use: Not Currently  . Drug use: Yes    Types: Marijuana  . Sexual activity: Not on file  Other Topics Concern  . Not on file  Social History Narrative  . Not on file   Social Determinants of Health   Financial Resource Strain: Not on file  Food Insecurity: Not on file  Transportation Needs: Not on file  Physical Activity: Not on file  Stress: Not on file  Social Connections: Not on file  Intimate Partner Violence: Not on file      PHYSICAL EXAM  Vitals:   10/17/20 1054  BP: 106/70  Pulse: 72  Weight: 122 lb (55.3 kg)  Height: 5\' 9"  (1.753 m)   Body mass index is 18.02 kg/m.   Generalized: Well developed, in no acute distress  Cardiology: normal rate and rhythm, no murmur auscultated  Respiratory: clear to auscultation bilaterally    Neurological examination  Mentation: Alert oriented to time, place, history taking. Follows all commands speech and language fluent Cranial nerve II-XII: Pupils were equal round reactive to light. Extraocular movements were full, visual field were full . Motor: The motor testing reveals 5 over 5 strength of all 4 extremities. Good symmetric motor tone is noted throughout.  Gait and station: Gait is normal.     DIAGNOSTIC DATA (LABS, IMAGING, TESTING) - I reviewed patient records, labs, notes, testing and imaging myself where available.  Lab Results  Component Value Date   WBC 5.4 11/04/2019   HGB 12.6 11/04/2019   HCT 38.5 11/04/2019   MCV 97.5 11/04/2019   PLT 190 11/04/2019      Component Value Date/Time   NA 139 11/04/2019 0951   K 3.9 11/04/2019 0951   CL 106 11/04/2019 0951   CO2 24 11/04/2019 0951   GLUCOSE 97 11/04/2019 0951   BUN 12 11/04/2019 0951   CREATININE 0.79 11/04/2019 0951   CALCIUM 9.1 11/04/2019 0951   PROT 6.8 11/04/2019 0951   ALBUMIN 4.1 11/04/2019  0951   AST 18 11/04/2019 0951   ALT 14 11/04/2019 0951   ALKPHOS 35 (L) 11/04/2019 0951   BILITOT 0.6 11/04/2019 0951   GFRNONAA >60 11/04/2019 0951   GFRAA >60 11/04/2019 0951   No results found for: CHOL, HDL, LDLCALC, LDLDIRECT, TRIG, CHOLHDL No results found for: 01/04/2020 No results found for: VITAMINB12 No results found for: TSH    ASSESSMENT AND PLAN  25 y.o. year old female  has a past medical history of ADHD, Anxiety, Asthma, Depression, IBS (irritable bowel syndrome), Migraine, and Mood disorder (HCC). here with   Single seizure (HCC)  Migraine without aura and without status migrainosus, not intractable  23 reports that she is doing very well.  Melissa Escobar works for migraine abortive therapy.  We will continue current treatment plan.  She denies any recent seizure activity.  Only 1 seizure reported in June.  MRI and EEG were normal.  Seizure precautions reviewed.  No further driving restrictions at this time.  Healthy lifestyle habits encouraged. She will monitor weight, closely. She was commended for remaining drug-free.  She will continue close follow-up with her primary care provider.  She will follow-up with me in 1 year, sooner if needed.  She verbalizes understanding and agreement with this plan.   July, MSN, FNP-C 10/17/2020, 11:21 AM  [facility] [address] [place], [place] [phone]  I reviewed the above note and documentation by the Nurse Practitioner and agree with the history, exam, assessment and plan as outlined above. I was available for consultation. [phone], MD, PhD [facility])

## 2020-10-17 ENCOUNTER — Ambulatory Visit: Payer: PRIVATE HEALTH INSURANCE | Admitting: Family Medicine

## 2020-10-17 ENCOUNTER — Encounter: Payer: Self-pay | Admitting: Family Medicine

## 2020-10-17 VITALS — BP 106/70 | HR 72 | Ht 69.0 in | Wt 122.0 lb

## 2020-10-17 DIAGNOSIS — G43009 Migraine without aura, not intractable, without status migrainosus: Secondary | ICD-10-CM

## 2020-10-17 DIAGNOSIS — R569 Unspecified convulsions: Secondary | ICD-10-CM | POA: Diagnosis not present

## 2020-10-17 MED ORDER — UBRELVY 50 MG PO TABS: ORAL_TABLET | ORAL | 11 refills | Status: DC

## 2020-11-29 ENCOUNTER — Telehealth: Payer: Self-pay | Admitting: Neurology

## 2020-11-29 NOTE — Telephone Encounter (Signed)
PA completed trough [employer]MM[employer] for the pt. KEY: [id number] Was approved immediately. "the request has been approved. The authorization is effective for a maximum of 6 fills from 11/29/2020 to 05/31/2021, as long as the member is enrolled in their current health plan. This has been approved for a quantity limit of 16 with a day supply limit of 30. A written notification letter will follow with additional details."

## 2021-02-13 IMAGING — MR MR HEAD WO/W CM
11 of 17 series · 24 of 48 positions shown · IV contrast (gadavist)
Comparison: CT head 11/04/2019

CLINICAL DATA: Nontraumatic seizure.

EXAM:
MRI HEAD WITHOUT AND WITH CONTRAST
TECHNIQUE: Multiplanar, multiecho pulse sequences of the brain and surrounding
structures were obtained without and with intravenous contrast.
CONTRAST:  6mL GADAVIST GADOBUTROL 1 MMOL/ML IV SOLN

[Series 1: (id) loc ssfse · axial · 10.0mm · 0.59mm/px · 1 of 33 slices shown]
[im 1/33]
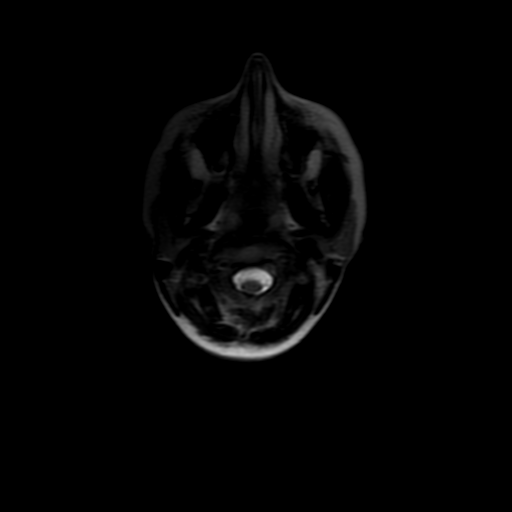

[Series 2: DWI · axial · 3.0mm · 0.94mm/px · z∈[-87,+51]mm · 5 of 100 slices shown (1 of 2)]
[im 1/100]
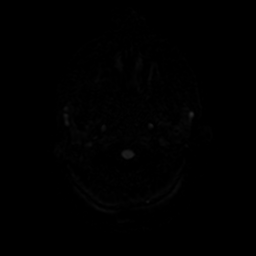
[im 25/100]
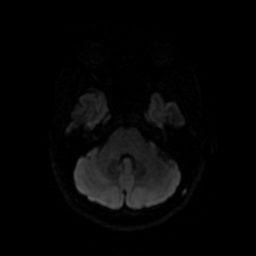
[im 50/100]
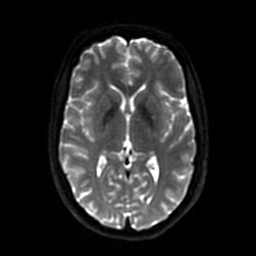
[im 75/100]
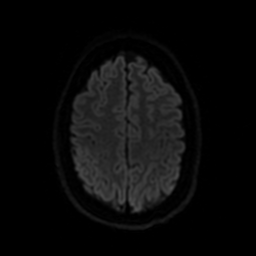
[im 100/100]
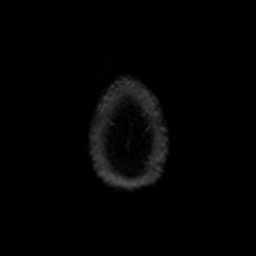

[Series 3: FLAIR · sagittal · 5.0mm · 0.23mm/px · 1 of 23 slices shown (1 of 3)]
[im 1/23]
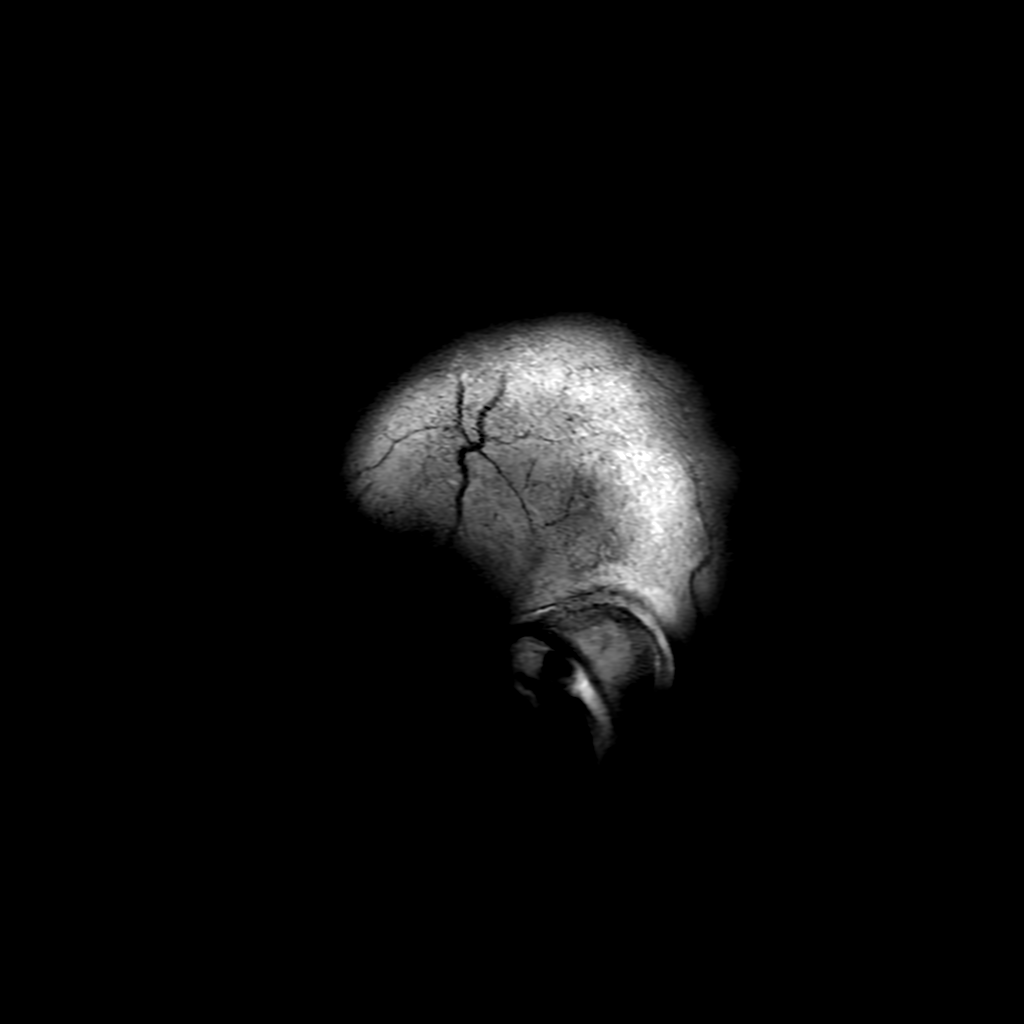

[Series 4: T2 · axial · 5.0mm · 0.23mm/px · 1 of 26 slices shown (1 of 2)]
[im 1/26]
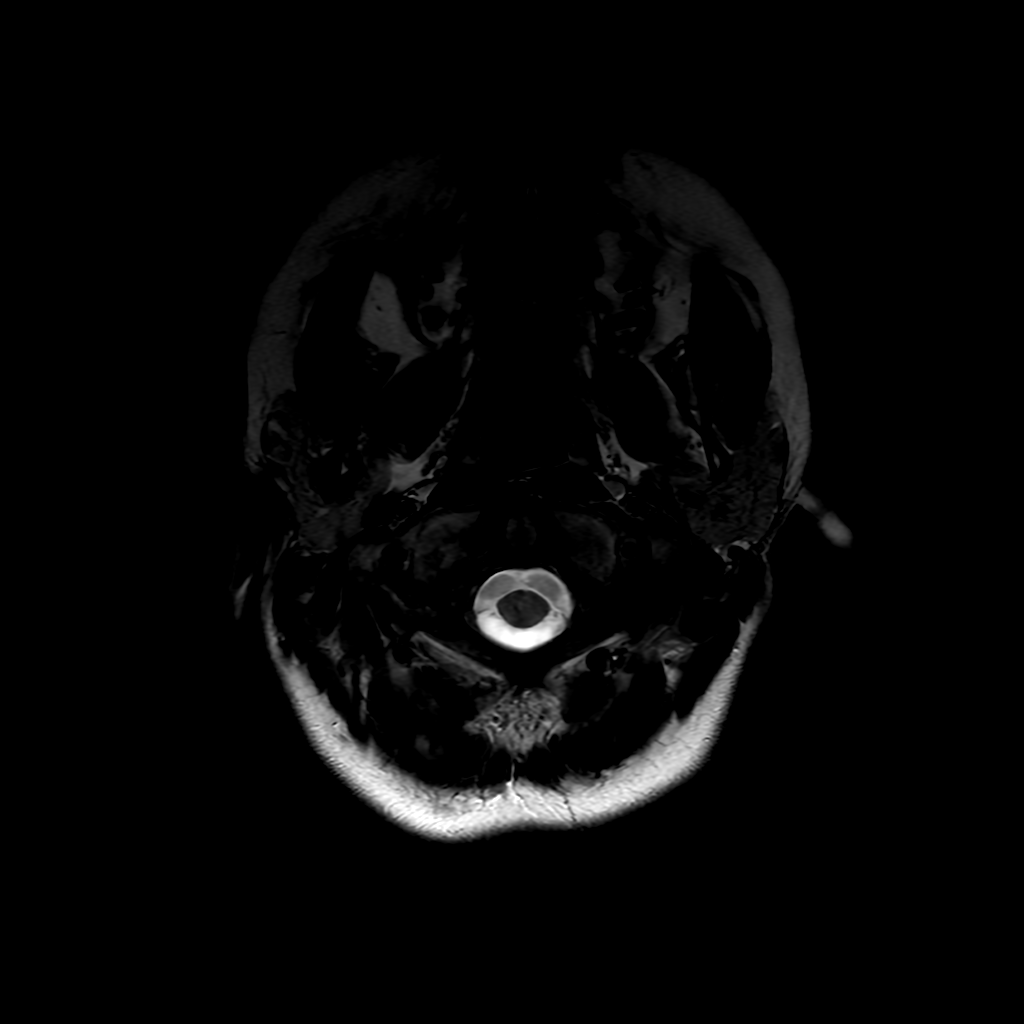

[Series 5: FLAIR · axial · 3.0mm · 0.41mm/px · 1 of 25 slices shown (2 of 3)]
[im 1/25]
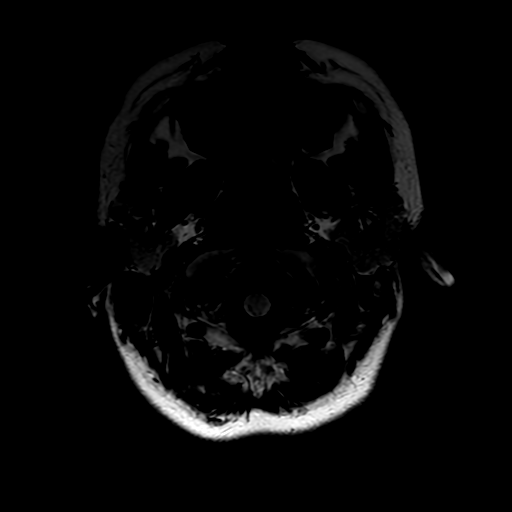

[Series 8: T2 · oblique · 3.0mm · 0.35mm/px · 2 of 38 slices shown (2 of 2)]
[im 1/38]
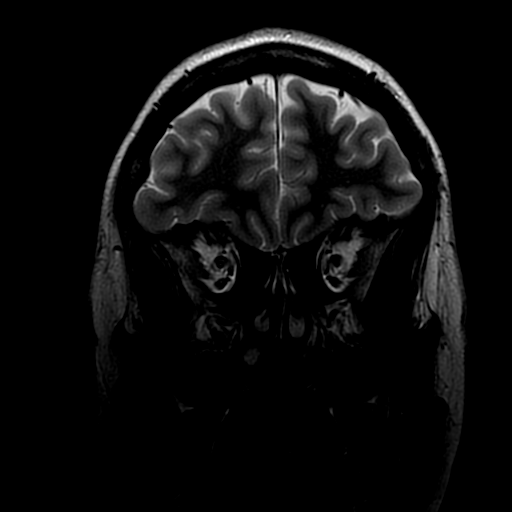
[im 38/38]
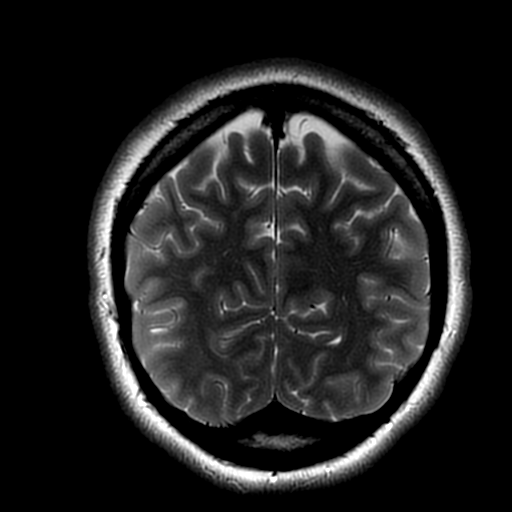

[Series 9: FLAIR · oblique · 3.0mm · 0.35mm/px · 2 of 38 slices shown (3 of 3)]
[im 1/38]
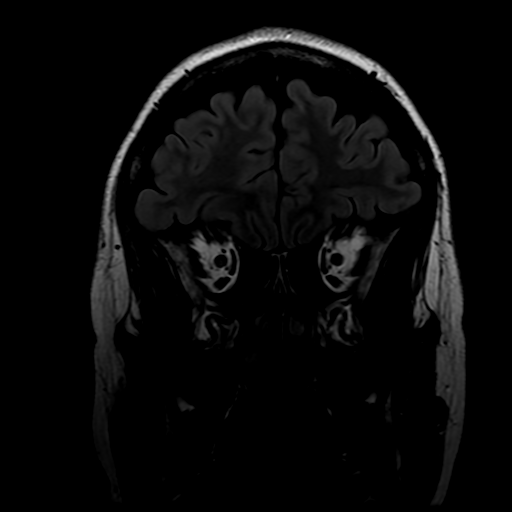
[im 38/38]
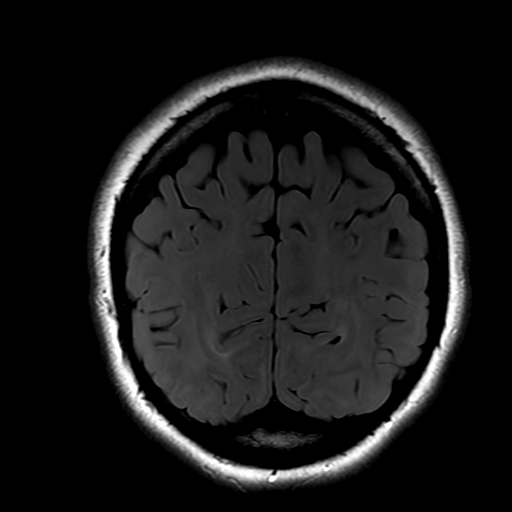

[Series 12: DWI · coronal · 4.0mm · 0.94mm/px · 4 of 76 slices shown (2 of 2)]
[im 1/76]
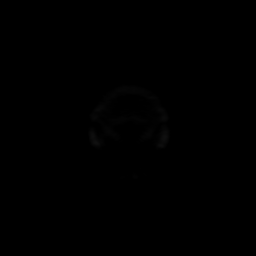
[im 26/76]
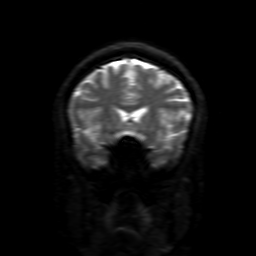
[im 51/76]
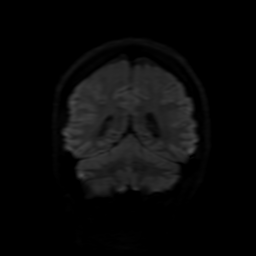
[im 76/76]
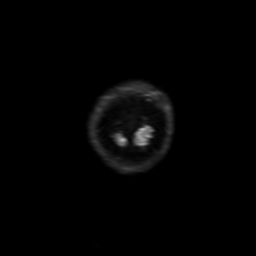

[Series 14: T1 post-contrast · coronal · 5.0mm · 0.47mm/px · 2 of 32 slices shown]
[im 1/32]
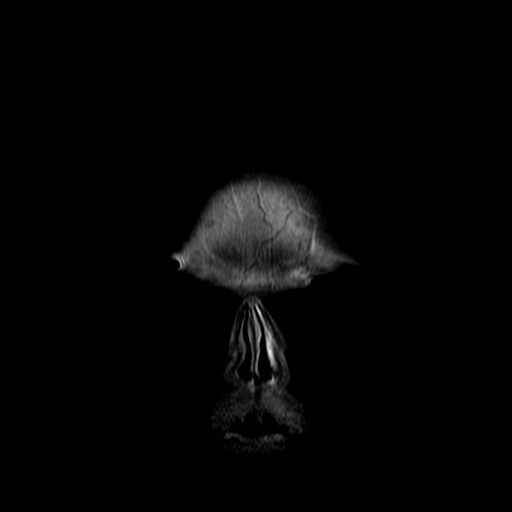
[im 32/32]
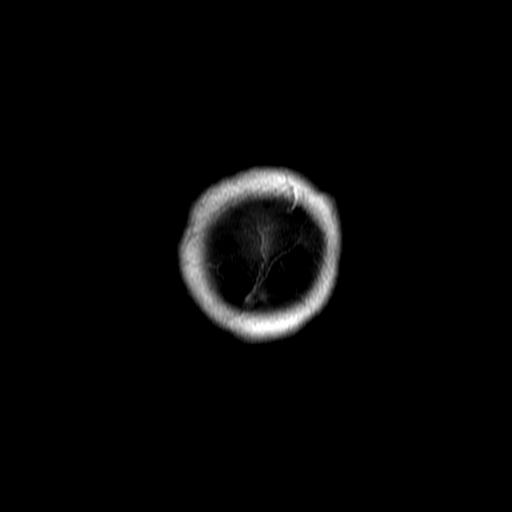

[Series 250: ADC · axial · 3.0mm · 0.94mm/px · z∈[-87,+51]mm · 3 of 50 slices shown (1 of 2)]
[im 1/50]
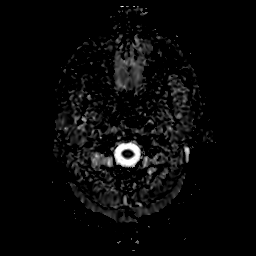
[im 25/50]
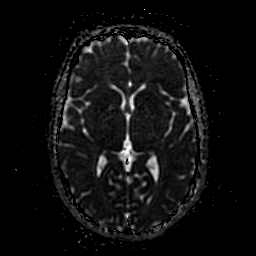
[im 50/50]
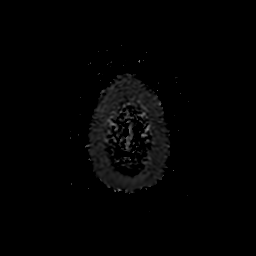

[Series 1250: ADC · coronal · 4.0mm · 0.94mm/px · 2 of 38 slices shown (2 of 2)]
[im 1/38]
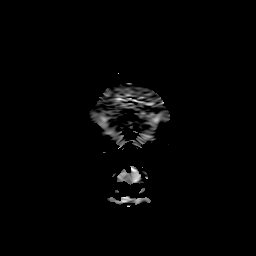
[im 38/38]
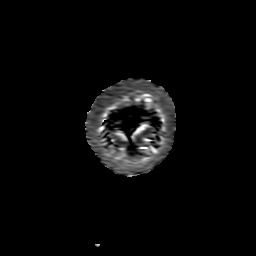

[24 of 48 positions shown; findings below may reference images not displayed]

FINDINGS: Brain: No acute infarction, hemorrhage, hydrocephalus, extra-axial
collection or mass lesion. Few small white matter hyperintensities
in the deep white matter best seen on coronal FLAIR. Periventricular
white matter normal.

Normal enhancement following contrast infusion.

Normal signal morphology in the medial temporal lobe.

Vascular: Normal arterial flow void

Skull and upper cervical spine: No focal bone marrow lesion.

Sinuses/Orbits: Paranasal sinuses clear.  Negative orbit

Other: None
IMPRESSION: No acute abnormality

Few small deep white matter hyperintensities bilaterally. Correlate
with history of complex migraine headaches or risk factors for small
vessel ischemia. Demyelinating disease also considered however the
location is not typical.

## 2021-05-23 ENCOUNTER — Telehealth: Payer: Self-pay | Admitting: *Deleted

## 2021-05-23 NOTE — Telephone Encounter (Signed)
Received PA request on CMM. Key: [id number]. Received the following response:  "Member has an active PA on file which is expiring on 05/31/2021 and has 2 no. of fills remaining."

## 2021-06-05 ENCOUNTER — Telehealth: Payer: Self-pay | Admitting: Family Medicine

## 2021-06-05 NOTE — Telephone Encounter (Signed)
Submitted PA Ubrelvy on [facility]. KeySA:[id number]. Received instant approval: "CaseId:[id number];Status:Approved;Review Type:Prior Auth;Coverage Start Date:06/05/2021;Coverage End Date:06/05/2022;"

## 2021-06-05 NOTE — Telephone Encounter (Signed)
Pt's mother, Kaileigh Viswanathan was encourage by the pharmacy to give new insurance to [employer] [employer] Member ID: [id number] Group [id number] BIN: [id number] PCN: [id number]

## 2021-09-07 ENCOUNTER — Encounter (HOSPITAL_BASED_OUTPATIENT_CLINIC_OR_DEPARTMENT_OTHER): Payer: Self-pay

## 2021-09-07 ENCOUNTER — Other Ambulatory Visit: Payer: Self-pay

## 2021-09-07 ENCOUNTER — Telehealth: Payer: Self-pay | Admitting: Family Medicine

## 2021-09-07 ENCOUNTER — Emergency Department (HOSPITAL_BASED_OUTPATIENT_CLINIC_OR_DEPARTMENT_OTHER)
Admission: EM | Admit: 2021-09-07 | Discharge: 2021-09-07 | Disposition: A | Discharge status: Home / Self Care | Payer: Commercial Managed Care - HMO | Attending: Emergency Medicine | Admitting: Emergency Medicine

## 2021-09-07 DIAGNOSIS — Z7951 Long term (current) use of inhaled steroids: Secondary | ICD-10-CM | POA: Insufficient documentation

## 2021-09-07 DIAGNOSIS — J45909 Unspecified asthma, uncomplicated: Secondary | ICD-10-CM | POA: Diagnosis not present

## 2021-09-07 DIAGNOSIS — N943 Premenstrual tension syndrome: Secondary | ICD-10-CM | POA: Diagnosis not present

## 2021-09-07 DIAGNOSIS — G43839 Menstrual migraine, intractable, without status migrainosus: Secondary | ICD-10-CM | POA: Diagnosis not present

## 2021-09-07 DIAGNOSIS — R519 Headache, unspecified: Secondary | ICD-10-CM | POA: Diagnosis present

## 2021-09-07 MED ORDER — PROMETHAZINE HCL 25 MG PO TABS: 25.0000 mg | ORAL_TABLET | Freq: Four times a day (QID) | ORAL | 0 refills | Status: DC | PRN

## 2021-09-07 NOTE — Telephone Encounter (Signed)
I returned mom call to discuss message. Reports as I was calling she was pulling into the parking low of [facility]. Reports patient could not handle symptoms any longer and is hopefully to have migraine cocktail administered at the hospital.  ? ?Mom will call back with updates once pt is home and settled and we can further discuss.  ?

## 2021-09-07 NOTE — ED Triage Notes (Signed)
Pt report bas migraine since 4am. Reports nausea. Rx for migraine medication. Pt states it usually works but it has not worked today. ?

## 2021-09-07 NOTE — ED Provider Notes (Signed)
?[facility] EMERGENCY DEPT ?Provider Note ? ? ?CSN: [id number] ?Arrival date & time: 09/07/21  1409 ? ?  ? ?History ? ?Chief Complaint  ?Patient presents with  ? Migraine  ? ? ?Melissa Escobar is a 26 y.o. female who presents emergency department with migraine since 4 AM.  Patient has history of migraines, and usually takes [place].  She states she tried taking that with ibuprofen this morning, a second dose later in the morning, and then ibuprofen again around noon.  She also reports taking Tylenol just that she was roomed in the ER.  She states her symptoms are starting to wear off now, but it originally did feel like her typical migraine.  She did have some nausea, no vomiting.  She is feeling fine leading up until today.  She states that she often gets migraines associated with her menstrual cycle. ? ? ?Migraine ?Associated symptoms include headaches.  ? ?  ? ?Home Medications ?Prior to Admission medications   ?Medication Sig Start Date End Date Taking? Authorizing Provider  ?promethazine (PHENERGAN) 25 MG tablet Take 1 tablet (25 mg total) by mouth every 6 (six) hours as needed for nausea or vomiting. 09/07/21  Yes Adric Wrede T, PA-C  ?albuterol (PROVENTIL HFA;VENTOLIN HFA) 108 (90 Base) MCG/ACT inhaler Inhale 1-2 puffs into the lungs every 6 (six) hours as needed for wheezing or shortness of breath.    [provider]  ?busPIRone (BUSPAR) 7.5 MG tablet Take 7.5 mg by mouth 2 (two) times daily as needed.  10/26/19   [provider]  ?cetirizine (ZYRTEC) 10 MG tablet Take 10 mg by mouth daily as needed for allergies.     [provider]  ?Ubrogepant (UBRELVY) 50 MG TABS TAKE 1 TABLET BY MOUTH AS NEEDED. MAY REPEAT ONCE IN 2 HOURS; NO MORE THAN 2 TABLETS IN  24 HOURS. 10/17/20   Lomax, Amy, NP  ?   ? ?Allergies    ?Tamiflu [oseltamivir phosphate]   ? ?Review of Systems   ?Review of Systems  ?Constitutional:  Negative for fever.  ?Eyes:  Positive for photophobia. Negative  for visual disturbance.  ?Gastrointestinal:  Positive for nausea. Negative for vomiting.  ?Neurological:  Positive for headaches. Negative for dizziness and syncope.  ?All other systems reviewed and are negative. ? ?Physical Exam ?Updated Vital Signs ?BP 109/71 (BP Location: Right Arm)   Pulse 73   Temp 98.5 ?F (36.9 ?C) (Oral)   Resp 18   Ht 5\' 9"  (1.753 m)   Wt 72.6 kg   SpO2 100%   BMI 23.63 kg/m?  ?Physical Exam ?Vitals and nursing note reviewed.  ?Constitutional:   ?   Appearance: Normal appearance.  ?HENT:  ?   Head: Normocephalic and atraumatic.  ?Eyes:  ?   Conjunctiva/sclera: Conjunctivae normal.  ?Pulmonary:  ?   Effort: Pulmonary effort is normal. No respiratory distress.  ?Skin: ?   General: Skin is warm and dry.  ?Neurological:  ?   Mental Status: She is alert.  ?   Comments: Neuro: Speech is clear, able to follow commands. PERRLA. EOMI. Sensation intact throughout. Str 5/5 all extremities.  ?Psychiatric:     ?   Mood and Affect: Mood normal.     ?   Behavior: Behavior normal.  ? ? ?ED Results / Procedures / Treatments   ?Labs ?(all labs ordered are listed, but only abnormal results are displayed) ?Labs Reviewed - No data to display ? ?EKG ?None ? ?Radiology ?No results found. ? ?  Procedures ?Procedures  ? ? ?Medications Ordered in ED ?Medications - No data to display ? ?ED Course/ Medical Decision Making/ A&P ?  ?                        ?Medical Decision Making ?Risk ?Prescription drug management. ? ? ?This patient is a 26 year old female with history of migraines who presents to the ED for concern of migraine.  ? ?Past Medical History / Co-morbidities / Social History: ?Depression, anxiety, IBS, ADHD, asthma, migraine ? ?Physical Exam: ?Physical exam performed. The pertinent findings include: Patient is afebrile, not tachycardic, not toxic appearing.  Normal neurologic exam as above. ?  ?Disposition: ?After consideration of the diagnostic results and the patients response to treatment, I feel  that patient's not requiring admission or inpatient treatment for her symptoms.  As patient's symptoms are improving, she denied any medications while in the ER today.  She like to go home and try to eat something as well as have some caffeine.  Low suspicion for other etiology that could be causing her symptoms today as patient has history of migraines, and states this feels the same as it normally does.  She is requesting a refill of her previous prescription for Phenergan.  She plans to follow-up with her neurologist next week.  Discussed reasons to return to the emergency department, and the patient is agreeable to the plan.  ? ?Final Clinical Impression(s) / ED Diagnoses ?Final diagnoses:  ?Intractable menstrual migraine without status migrainosus  ? ? ?Rx / DC Orders ?ED Discharge Orders   ? ?      Ordered  ?  promethazine (PHENERGAN) 25 MG tablet  Every 6 hours PRN       ? 09/07/21 1626  ? ?  ?  ? ?  ? ?Portions of this report may have been transcribed using voice recognition software. Every effort was made to ensure accuracy; however, inadvertent computerized transcription errors may be present. ? ?  ?Su Monks, PA-C ?09/07/21 1657 ? ?  ?Charlynne Pander, MD ?09/09/21 1455 ? ?

## 2021-09-07 NOTE — Discharge Instructions (Signed)
You are seen in the emergency department today for migraine. ? ?As we discussed, I have written you a prescription for Phenergan.  You can take 1 tablet by mouth every 6 hours as needed for nausea or vomiting. ? ?Continue to monitor how you're doing and return to the ER for new or worsening symptoms.  ?

## 2021-09-07 NOTE — Telephone Encounter (Signed)
Pt's mother has called to report that pt has taken her 2 Ubrogepant (UBRELVY) 50 MG TABS and 4 advil and is now nauseated  .  Pt's mother said she is really trying to avoid taking pt to ED.  She'd like to know if something can be called in for pt's nauseated state.  Mother declined option of sending my chart message, please call.   ?

## 2021-10-17 ENCOUNTER — Ambulatory Visit: Payer: PRIVATE HEALTH INSURANCE | Admitting: Family Medicine

## 2021-10-21 ENCOUNTER — Telehealth: Payer: Self-pay | Admitting: Diagnostic Neuroimaging

## 2021-10-21 NOTE — Telephone Encounter (Signed)
Called over the weekend for --> Needs prior auth for ubrelvy.   Will forward to pod to take care of this on Tuesday.     Suanne Marker, MD 10/21/2021, 6:11 PM Certified in Neurology, Neurophysiology and Neuroimaging  [facility] [address] [place], [place] [address] [phone]

## 2021-10-24 ENCOUNTER — Encounter: Payer: Self-pay | Admitting: Neurology

## 2021-10-24 ENCOUNTER — Other Ambulatory Visit: Payer: Self-pay | Admitting: *Deleted

## 2021-10-24 MED ORDER — UBRELVY 50 MG PO TABS: ORAL_TABLET | ORAL | 2 refills | Status: DC

## 2021-10-24 NOTE — Telephone Encounter (Signed)
Attempted a PA for the patient as requested and the response I received is that the PA is already on file for the pt.   An active PA is already on file with expiration date of 06/05/2022. Please wait to resubmit request within 60 days of that expiration date to obtain a PA renewal.

## 2021-10-24 NOTE — Telephone Encounter (Signed)
Called [facility] pharmacy and they stated that the ubrelvy went through today at no charge. They will notify patient it is ready for pick up.

## 2022-01-30 ENCOUNTER — Telehealth: Payer: Self-pay | Admitting: Family Medicine

## 2022-01-30 MED ORDER — UBRELVY 50 MG PO TABS: ORAL_TABLET | ORAL | 0 refills | Status: DC

## 2022-01-30 NOTE — Telephone Encounter (Signed)
Pt is calling and requesting a refill onUbrogepant (UBRELVY) 50 MG TABS. Pt is requesting [facility]ill [facility] to [facility] [address] [place]. [phone]

## 2022-01-30 NOTE — Telephone Encounter (Signed)
Refill sent for the pt. Must keep upcoming apt scheduled in sept for future refills.

## 2022-02-19 NOTE — Progress Notes (Deleted)
No chief complaint on file.    HISTORY OF PRESENT ILLNESS:  02/19/22 ALL: Melissa Escobar returns for follow up for migraines. She continues Melissa Escobar as needed.   Seizure?  10/17/2020 ALL: Melissa Escobar returns for follow up for previous seizure in setting of benzodiazepine withdrawal and migraines. She continues Ubrelvy for abortive therapy. She is doing very well. No seizure activity. She is doing well with sobriety. She is working at [employer] and is being promoted. She has been working more hours. She quit eating as much junk food and is eating healthier. She is drinking more water. She has about 2-3 headache days per month, usually around menstrual cycle. Roselyn Meier works very well. She has lost about 10 pounds over the past 6 months but relates this to lifestyle changes.    05/04/2020 ALL:  Melissa Escobar is a 26 y.o. 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  She continues to participate in NA and denies recent use of Xanax or other street drugs.   Migraines are well managed with Ubrelvy for abortive therapy. She uses medication about 2-3 times per month on average but has used it up to 4-6 times. Headaches are worse with skipped meals or menstrual cycle.   She denies any concerns of seizure activity since last being seen. She wishes to resume driving privileges.     HISTORY (copied from previous note)  Melissa Escobar is a 26 year old right-handed woman with an underlying medical history of mood disorder, irritable bowel syndrome, asthma, ADHD and migraine headaches, who presents for evaluation of a new problem, concern for recent seizure activity.  The patient is accompanied by her mother today.  The patient was evaluated in the emergency room for a seizure-like event that day on 11/04/2019.  I reviewed the emergency room records.  UDS was positive for benzodiazepines and THC.  She has a prior history of  benzodiazepine abuse but denied any recent overuse or withdrawal symptoms. I have previously seen her for migraine evaluation. She had a head CT without contrast on 10/27/2019 and I reviewed the results: IMPRESSION: Normal head CT.  No abnormality seen to explain seizure.   She had a follow-up appointment with Debbora Presto, nurse practitioner for migraine management on 10/14/2019, at which time she reported that her migraines had improved and Ubrelvy was helping.  She also reported that she had weaned off of Xanax with the help of rehab.    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  She is getting help through an outpatient rehab program [facility].  She is enrolled in it now.  Mom reports that patient would like to continue to be independent.  She is disappointed about the no driving.  I reiterated the driving rules for 6 months so long as patient remains seizure-free.  I also explained to them that she does not have to be on epilepsy medication.  She took Xanax 2 days ago for the last time, 1 mg per patient.  She does report intermittent stress and anxiety flareup and sometimes she does not sleep very well.  She does not drink any alcohol and reports that her father had alcoholism.  She had a convulsive event on 11/04/2019.  This was witnessed by her girlfriend.  Girlfriend sister has epilepsy so the girlfriend felt confident that patient had a convulsive seizure event, patient reports that she chewed on her  tongue.  She had woken up and had some visual disturbance and that she felt she had a scotoma that she was able to track in her visual field but then she blacked out.  She has no recollection of the event, she did not soil herself or wet herself but was sore afterwards.  She has no prior history of seizures or epilepsy.  She denies any neurological symptoms at this time.    REVIEW OF SYSTEMS: Out of a complete 14 system review of symptoms, the patient complains  only of the following symptoms, none and all other reviewed systems are negative.   ALLERGIES: Allergies  Allergen Reactions   Tamiflu [Oseltamivir Phosphate] Nausea And Vomiting     HOME MEDICATIONS: Outpatient Medications Prior to Visit  Medication Sig Dispense Refill   albuterol (PROVENTIL HFA;VENTOLIN HFA) 108 (90 Base) MCG/ACT inhaler Inhale 1-2 puffs into the lungs every 6 (six) hours as needed for wheezing or shortness of breath.     busPIRone (BUSPAR) 7.5 MG tablet Take 7.5 mg by mouth 2 (two) times daily as needed.      cetirizine (ZYRTEC) 10 MG tablet Take 10 mg by mouth daily as needed for allergies.      promethazine (PHENERGAN) 25 MG tablet Take 1 tablet (25 mg total) by mouth every 6 (six) hours as needed for nausea or vomiting. 30 tablet 0   Ubrogepant (UBRELVY) 50 MG TABS TAKE 1 TABLET BY MOUTH AS NEEDED. MAY REPEAT ONCE IN 2 HOURS; NO MORE THAN 2 TABLETS IN  24 HOURS. 10 tablet 0   No facility-administered medications prior to visit.     PAST MEDICAL HISTORY: Past Medical History:  Diagnosis Date   ADHD    Anxiety    Asthma    Depression    IBS (irritable bowel syndrome)    Migraine    Mood disorder ([place])      PAST SURGICAL HISTORY: Past Surgical History:  Procedure Laterality Date   ROOT CANAL     TYMPANOSTOMY TUBE PLACEMENT       FAMILY HISTORY: Family History  Problem Relation Age of Onset   Menstrual problems Mother    Hypertension Mother    Skin cancer Mother    Breast cancer Maternal Grandmother    Alcoholism Father    Leukemia Maternal Grandfather    Alcoholism Paternal Grandfather      SOCIAL HISTORY: Social History   Socioeconomic History   Marital status: Single    Spouse name: Not on file   Number of children: Not on file   Years of education: Not on file   Highest education level: Not on file  Occupational History   Not on file  Tobacco Use   Smoking status: Former    Types: Cigarettes   Smokeless tobacco: Never   Vaping Use   Vaping Use: Every day  Substance and Sexual Activity   Alcohol use: Not Currently   Drug use: Not Currently    Types: Marijuana   Sexual activity: Not on file  Other Topics Concern   Not on file  Social History Narrative   Not on file   Social Determinants of Health   Financial Resource Strain: Not on file  Food Insecurity: Not on file  Transportation Needs: Not on file  Physical Activity: Not on file  Stress: Not on file  Social Connections: Not on file  Intimate Partner Violence: Not on file      PHYSICAL EXAM  There were no vitals filed for  this visit.  There is no height or weight on file to calculate BMI.   Generalized: Well developed, in no acute distress  Cardiology: normal rate and rhythm, no murmur auscultated  Respiratory: clear to auscultation bilaterally    Neurological examination  Mentation: Alert oriented to time, place, history taking. Follows all commands speech and language fluent Cranial nerve II-XII: Pupils were equal round reactive to light. Extraocular movements were full, visual field were full . Motor: The motor testing reveals 5 over 5 strength of all 4 extremities. Good symmetric motor tone is noted throughout.  Gait and station: Gait is normal.     DIAGNOSTIC DATA (LABS, IMAGING, TESTING) - I reviewed patient records, labs, notes, testing and imaging myself where available.  Lab Results  Component Value Date   WBC 5.4 11/04/2019   HGB 12.6 11/04/2019   HCT 38.5 11/04/2019   MCV 97.5 11/04/2019   PLT 190 11/04/2019      Component Value Date/Time   NA 139 11/04/2019 0951   K 3.9 11/04/2019 0951   CL 106 11/04/2019 0951   CO2 24 11/04/2019 0951   GLUCOSE 97 11/04/2019 0951   BUN 12 11/04/2019 0951   CREATININE 0.79 11/04/2019 0951   CALCIUM 9.1 11/04/2019 0951   PROT 6.8 11/04/2019 0951   ALBUMIN 4.1 11/04/2019 0951   AST 18 11/04/2019 0951   ALT 14 11/04/2019 0951   ALKPHOS 35 (L) 11/04/2019 0951   BILITOT  0.6 11/04/2019 0951   GFRNONAA >60 11/04/2019 0951   GFRAA >60 11/04/2019 0951   No results found for: "CHOL", "HDL", "LDLCALC", "LDLDIRECT", "TRIG", "CHOLHDL" No results found for: "HGBA1C" No results found for: "VITAMINB12" No results found for: "TSH"    ASSESSMENT AND PLAN  26 y.o. year old female  has a past medical history of ADHD, Anxiety, Asthma, Depression, IBS (irritable bowel syndrome), Migraine, and Mood disorder ([place]). here with   No diagnosis found.  Melissa Escobar reports that she is doing very well.  Roselyn Meier works for migraine abortive therapy.  We will continue current treatment plan.  She denies any recent seizure activity.  Only 1 seizure reported in June.  MRI and EEG were normal.  Seizure precautions reviewed.  No further driving restrictions at this time.  Healthy lifestyle habits encouraged. She will monitor weight, closely. She was commended for remaining drug-free.  She will continue close follow-up with her primary care provider.  She will follow-up with me in 1 year, sooner if needed.  She verbalizes understanding and agreement with this plan.   Debbora Presto, MSN, FNP-C 02/19/2022, 10:10 AM  [facility] [address], [place], [place] [address] [phone]

## 2022-02-20 ENCOUNTER — Ambulatory Visit: Payer: Commercial Managed Care - HMO | Admitting: Family Medicine

## 2022-03-01 ENCOUNTER — Telehealth: Payer: Self-pay | Admitting: Family Medicine

## 2022-03-01 NOTE — Telephone Encounter (Signed)
Lisa/Sharitta- can you call mother back?  Pt has not been seen since 10/17/20. Cx appt 02/20/22. She has appt 08/01/22 but will need to schedule soonest appt available. Cannot refill/prescribe medications until seen since it has been over a year since last appointment.

## 2022-03-01 NOTE — Telephone Encounter (Signed)
Pt's mother states promethazine (PHENERGAN) 25 MG tablet was prescribed by the hospital for migraines(according to mother pt also has menstrual migraines). Pt's mother is asking if Amy, NP will take over managing pt's promethazine (PHENERGAN) 25 MG tablet Rx.  If so, mother would like Amy, NP to call the promethazine (PHENERGAN) 25 MG tablet into  [place] [phone]

## 2022-03-05 ENCOUNTER — Ambulatory Visit (INDEPENDENT_AMBULATORY_CARE_PROVIDER_SITE_OTHER): Payer: Commercial Managed Care - HMO | Admitting: Neurology

## 2022-03-05 ENCOUNTER — Encounter: Payer: Self-pay | Admitting: Neurology

## 2022-03-05 VITALS — BP 111/69 | HR 77 | Ht 69.0 in | Wt 193.4 lb

## 2022-03-05 DIAGNOSIS — G43009 Migraine without aura, not intractable, without status migrainosus: Secondary | ICD-10-CM

## 2022-03-05 MED ORDER — UBRELVY 50 MG PO TABS: ORAL_TABLET | ORAL | 5 refills | Status: DC

## 2022-03-05 MED ORDER — PROMETHAZINE HCL 25 MG PO TABS: 25.0000 mg | ORAL_TABLET | Freq: Four times a day (QID) | ORAL | 1 refills | Status: AC | PRN

## 2022-03-05 NOTE — Patient Instructions (Signed)
It was nice to see you again today.  I am glad you are doing fairly well with your migraines, I renewed your prescriptions for Phenergan as needed for nausea and Ubrelvy as needed for migraines.  Please be mindful of your migraine triggers, try to make enough time for sleep, 7-1/2 to 8-1/2 hours are generally recommended nightly.  Please try to hydrate well with water, about 8 cups of water per day, 8 ounce size each.  Continue to maintain a healthy weight.  Please follow-up in 1 year to see Amy Lomax, we can do a video visit at the time.  Please make an appointment with an optometrist of your choosing since you have not had an eye examination in years.  This is just to establish a baseline and to make sure there is no need for eyeglasses, as strain on the eyes can trigger migraines as well.

## 2022-03-05 NOTE — Progress Notes (Signed)
Subjective:    Patient ID: Melissa Escobar is a 26 y.o. female.  HPI    Interim history:   Melissa Escobar is a 26 year old right-handed woman with an underlying medical history of mood disorder, irritable bowel syndrome, asthma, ADHD and migraine headaches, who presents for follow up consultation of her migraines and Hx of a Sz-like event in 2021. The patient is unaccompanied today.  I last saw her on 12/03/2019, at which time she presented for a new problem visit for seizure-like activity.  She was referred by the emergency room.  We proceeded with a brain MRI and EEG.  She had an EEG through our office on 12/21/2019 and I reviewed the results: Conclusion: This is a normal awake EEG.  There is no electrodiagnostic evidence of epileptiform discharge. She had a brain with and without contrast on 12/20/2019 and I reviewed the results: Impression: No acute abnormality   Few small deep white matter hyperintensities bilaterally. Correlate with history of complex migraine headaches or risk factors for small vessel ischemia. Demyelinating disease also considered however the location is not typical.   She saw Debbora Presto, NP on 05/04/2020, at which time she reported that her migraines were well managed with Roselyn Meier as needed.  She was advised that she could resume driving as she did not have any further seizure-like events.  She was advised to follow-up routinely in 1 year.  She saw Debbora Presto, NP on 10/17/2020, at which time she reported doing well.  She had made some lifestyle changes, has lost about 10 pounds, migraines were comfortable with visit for Ubrelvy.  Today, 03/05/2022: She reports doing fairly well.  Migraines respond to Ubrelvy 50 mg strength as needed.  She has been trying to manage her triggers.  She has made some lifestyle changes, has eliminated alcohol, does not smoke any THC any longer, does vape nicotine but does not smoke cigarettes.  She has had significant weight gain in the past couple years.   She is trying to hydrate well and manage weight.  She works at [employer].  She tries to make enough time for sleep but sleep schedule varies because of work schedule varies.  She may be in bed somewhere between 10 and midnight and rise time may be as early as 4 AM and as late as 10 AM.  No seizure-like events, no visual symptoms, no new neurological symptoms.  She has not had an eye examination in years.  She does not have any visual complaints but is reminded to get checked out for baseline.  She did present to the emergency room in April 2023 for a severe migraine with nausea.  She reports that Phenergan has helped her nausea.  She would like a refill on it.  The patient's allergies, current medications, family history, past medical history, past social history, past surgical history and problem list were reviewed and updated as appropriate.    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  She is getting help through an outpatient rehab program [facility].  She is enrolled in it now.  Mom reports that patient would like to continue to be independent.  She is disappointed about the no driving.  I reiterated the driving rules for 6 months so long as patient remains seizure-free.  I also explained to them that she does not have to be on epilepsy medication.  She took Xanax 2 days ago for the last time,  1 mg per patient.  She does report intermittent stress and anxiety flareup and sometimes she does not sleep very well.  She does not drink any alcohol and reports that her father had alcoholism.  She had a convulsive event on 11/04/2019.  This was witnessed by her girlfriend.  Girlfriend sister has epilepsy so the girlfriend felt confident that patient had a convulsive seizure event, patient reports that she chewed on her tongue.  She had woken up and had some visual disturbance and that she felt she had a scotoma that she was able to track in her  visual field but then she blacked out.  She has no recollection of the event, she did not soil herself or wet herself but was sore afterwards.  She has no prior history of seizures or epilepsy.  She denies any neurological symptoms at this time.   The patient was evaluated in the emergency room for a seizure-like event that day on 11/04/2019.  I reviewed the emergency room records.  UDS was positive for benzodiazepines and THC.  She has a prior history of benzodiazepine abuse but denied any recent overuse or withdrawal symptoms. I have previously seen her for migraine evaluation. She had a head CT without contrast on 10/27/2019 and I reviewed the results: IMPRESSION: Normal head CT.  No abnormality seen to explain seizure.   She had a follow-up appointment with Debbora Presto, nurse practitioner for migraine management on 10/14/2019, at which time she reported that her migraines had improved and Ubrelvy was helping.  She also reported that she had weaned off of Xanax with the help of rehab.      07/15/19: 26 year old right-handed woman with an underlying medical history of asthma, irritable bowel syndrome, anxiety, depression, and ADHD, who presented to the emergency room on 07/08/2019 with a history of recurrent migraines.  She has been on Maxalt.  She has had some visual symptoms and also at times has been noted to be confused and has had difficulty speaking at times.  Neurological exam in the emergency room was nonfocal.  She did not have any scans done.  She did not have any symptomatic treatment in the ER. She had a head CT without contrast and maxillofacial CT without contrast on 07/10/2014 and I reviewed the results: IMPRESSION: CT HEAD: No acute intracranial process ; normal noncontrast CT of the head.   CT MAXILLOFACIAL: No facial fracture.   Mild LEFT periorbital soft tissue swelling without postseptal extent. She has had migraines since her teenage years.  She does not typically have a visual aura.   She has associated nausea and vomiting at times, she has associated photophobia.  She has been not Maxalt per primary care PA which has been helpful, migraine frequency is generally about 2-3 times per month, typically around her menstrual period.  Her mother is concerned about her taking the Maxalt for all this time, prior to that she was on Imitrex.  She has not had any other prescription medication, no preventatives.  She has a family history of migraines in her mother.  Patient does not smoke any cigarettes but vapes nicotine.  She smokes marijuana very occasionally.  She does not drink any alcohol.  She limits her caffeine but does drink on a regular basis some caffeine.  She feels like she sleeps well.  She Admits that she may skip meals at times.  She does not always hydrate well with water she admits.   Her Past Medical History Is Significant For:  Past Medical History:  Diagnosis Date   ADHD    Anxiety    Asthma    Depression    IBS (irritable bowel syndrome)    Migraine    Mood disorder ([place])     Her Past Surgical History Is Significant For: Past Surgical History:  Procedure Laterality Date   ROOT CANAL     TYMPANOSTOMY TUBE PLACEMENT      Her Family History Is Significant For: Family History  Problem Relation Age of Onset   Menstrual problems Mother    Hypertension Mother    Skin cancer Mother    Breast cancer Maternal Grandmother    Alcoholism Father    Leukemia Maternal Grandfather    Alcoholism Paternal Grandfather     Her Social History Is Significant For: Social History   Socioeconomic History   Marital status: Single    Spouse name: Not on file   Number of children: Not on file   Years of education: Not on file   Highest education level: Not on file  Occupational History   Not on file  Tobacco Use   Smoking status: Former    Types: Cigarettes   Smokeless tobacco: Never  Vaping Use   Vaping Use: Every day  Substance and Sexual Activity   Alcohol use:  Not Currently   Drug use: Not Currently    Types: Marijuana   Sexual activity: Not on file  Other Topics Concern   Not on file  Social History Narrative   Not on file   Social Determinants of Health   Financial Resource Strain: Not on file  Food Insecurity: Not on file  Transportation Needs: Not on file  Physical Activity: Not on file  Stress: Not on file  Social Connections: Not on file    Her Allergies Are:  Allergies  Allergen Reactions   Tamiflu [Oseltamivir Phosphate] Nausea And Vomiting  :   Her Current Medications Are:  Outpatient Encounter Medications as of 03/05/2022  Medication Sig   albuterol (PROVENTIL HFA;VENTOLIN HFA) 108 (90 Base) MCG/ACT inhaler Inhale 1-2 puffs into the lungs every 6 (six) hours as needed for wheezing or shortness of breath.   cetirizine (ZYRTEC) 10 MG tablet Take 10 mg by mouth daily as needed for allergies.    Probiotic Product (PROBIOTIC PO) Take by mouth.   [DISCONTINUED] promethazine (PHENERGAN) 25 MG tablet Take 1 tablet (25 mg total) by mouth every 6 (six) hours as needed for nausea or vomiting.   [DISCONTINUED] Ubrogepant (UBRELVY) 50 MG TABS TAKE 1 TABLET BY MOUTH AS NEEDED. MAY REPEAT ONCE IN 2 HOURS; NO MORE THAN 2 TABLETS IN  24 HOURS.   busPIRone (BUSPAR) 7.5 MG tablet Take 7.5 mg by mouth 2 (two) times daily as needed.    promethazine (PHENERGAN) 25 MG tablet Take 1 tablet (25 mg total) by mouth every 6 (six) hours as needed for nausea or vomiting.   Ubrogepant (UBRELVY) 50 MG TABS TAKE 1 TABLET BY MOUTH AS NEEDED. MAY REPEAT ONCE IN 2 HOURS; NO MORE THAN 2 TABLETS IN  24 HOURS.   No facility-administered encounter medications on file as of 03/05/2022.  :  Review of Systems:  Out of a complete 14 point review of systems, all are reviewed and negative with the exception of these symptoms as listed below:  Review of Systems  Neurological:        Pt here for migraine f/u  Pt states 4 migraines in last month Pt states she manages  her  Migraines better She catches migraines and takes Ubrevly on onset     Objective:  Neurological Exam  Physical Exam Physical Examination:   Vitals:   03/05/22 0736  BP: 111/69  Pulse: 77    General Examination: The patient is a very pleasant 26 y.o. female in no acute distress. She appears well-developed and well-nourished and well groomed.  Good spirits.  HEENT: Normocephalic, atraumatic, pupils are equal, round and reactive to light, extraocular tracking well-preserved, no obvious nystagmus.  Face is symmetric with normal facial animation, speech is clear without dysarthria, hypophonia or voice tremor.  Neck with full range of motion, no carotid bruits.  Airway examination reveals no significant mouth dryness, tongue protrudes centrally and palate elevates symmetrically.    Chest: Clear to auscultation without wheezing, rhonchi or crackles noted.   Heart: S1+S2+0, regular and normal without murmurs, rubs or gallops noted.    Abdomen: Soft, non-tender and non-distended.   Extremities: There is no edema in the distal lower extremities bilaterally.  Skin: Warm and dry without trophic changes noted.   Musculoskeletal: exam reveals no obvious joint deformities.    Neurologically:  Mental status: The patient is awake, alert and oriented in all 4 spheres. Her immediate and remote memory, attention, language skills and fund of knowledge are appropriate. There is no evidence of aphasia, agnosia, apraxia or anomia. Speech is clear with normal prosody and enunciation. Thought process is linear. Mood is normal and affect is normal.  Cranial nerves II - XII are as described above under HEENT exam.  Motor exam: Normal bulk, strength and tone is noted. There is no tremor. Fine motor skills and coordination: grossly intact.  Reflexes 1+ throughout. Cerebellar testing: No dysmetria or intention tremor. There is no truncal or gait ataxia.  Normal finger-to-nose and normal heel-to-shin. Sensory  exam: intact to light touch.  Gait, station and balance: She stands easily. No veering to one side is noted. No leaning to one side is noted. Posture is age-appropriate and stance is narrow based. Gait shows normal stride length and normal pace. No problems turning are noted.    Assessment and Plan:    In summary, TAIWO BODE is a 26 year old female with an underlying medical history of asthma, irritable bowel syndrome, anxiety, depression, migraine, benzodiazepine addition (remains in remission for nearly 2 years), overweight state, ADHD, who presents for follow-up consultation of her migraines.  She has done well with Roselyn Meier as needed and had a prescription for Phenergan as needed which helped to nausea.  She had a seizure event in June 2021, since then she has not had any recurrence, has stopped using benzodiazepines and THC as well as alcohol.  She has made some positive lifestyle changes.  She has gained weight in the interim.  She is monitoring her weight.  She works and has been driving without difficulty.  She has tries to hydrate well with water and tries to make enough time for sleep.  She is commended for her lifestyle changes.  She is reminded to continue to use Phenergan only as needed and Roselyn Meier only as needed.  She is encouraged to make an appointment with an optometrist of her choosing to get a baseline eye examination done.  Neurological exam is nonfocal.  She can follow-up routinely to see Debbora Presto, NP in 1 year, we can offer her a video visit through [facility].  I answered all her questions today and renewed her prescriptions for Ubrelvy 50 mg strength and Phenergan 25 mg  strength.  She presented.  Of note, brain MRI in 2021 and EEG in 2021 were unremarkable.   I spent 30 minutes in total face-to-face time and in reviewing records during pre-charting, more than 50% of which was spent in counseling and coordination of care, reviewing test results, reviewing medications and treatment  regimen and/or in discussing or reviewing the diagnosis of migraine headaches, the prognosis and treatment options. Pertinent laboratory and imaging test results that were available during this visit with the patient were reviewed by me and considered in my medical decision making (see chart for details).

## 2022-05-09 ENCOUNTER — Telehealth: Payer: Self-pay | Admitting: *Deleted

## 2022-05-09 NOTE — Telephone Encounter (Signed)
Submitted PA Ubrelvy on covermymeds. [employer]. Received instant approval: "CaseId:[id number];Status:Approved;Review Type:Prior Auth;Coverage Start Date:05/09/2022;Coverage End Date:05/09/2023;"

## 2022-06-11 ENCOUNTER — Encounter: Payer: Self-pay | Admitting: Neurology

## 2022-06-13 ENCOUNTER — Other Ambulatory Visit: Payer: Self-pay | Admitting: Neurology

## 2022-06-13 MED ORDER — UBRELVY 100 MG PO TABS: ORAL_TABLET | ORAL | 2 refills | Status: DC

## 2022-06-13 NOTE — Telephone Encounter (Signed)
Per Dr Rexene Alberts, ok to change pt's Ubrelvy to 100 mg from 50 mg prescription. Rx sent to [facility] @ [place].

## 2022-06-13 NOTE — Telephone Encounter (Signed)
Pt is calling. Asking if someone will please answer her mychart message. Pt is requesting a cal back.

## 2022-06-14 NOTE — Telephone Encounter (Signed)
Called [employer] spoke with Ulice Dash completed PA Ubrevly over the phone.  PA Alvie Heidelberg has a Urgent status. Ulice Dash fom Christella Scheuermann states will take up to 72 hours for a approval . Called patient spoke with mother (DPR checked) Gave her the status of the PA Ubrevly. Mother expressed understanding and thanked me for calling.

## 2022-06-14 NOTE — Telephone Encounter (Signed)
Pt called stating that her insurance is needing to be called for a PA in order for her to get the Ubrogepant (UBRELVY) 100 MG TABS  Pt states that the urgent PA line for Christella Scheuermann is [phone] and just in case her ID for Christella Scheuermann is [id number] Please advise.

## 2022-08-01 ENCOUNTER — Ambulatory Visit: Payer: Commercial Managed Care - HMO | Admitting: Family Medicine

## 2022-10-04 ENCOUNTER — Other Ambulatory Visit: Payer: Self-pay | Admitting: Neurology

## 2023-03-04 NOTE — Patient Instructions (Signed)
Below is our plan:  We will continue Ubrelvy as needed.   Please make sure you are staying well hydrated. I recommend 50-60 ounces daily. Well balanced diet and regular exercise encouraged. Consistent sleep schedule with 6-8 hours recommended.   Please continue follow up with care team as directed.   Follow up with me in 1 year   You may receive a survey regarding today's visit. I encourage you to leave honest feed back as I do use this information to improve patient care. Thank you for seeing me today!   GENERAL HEADACHE INFORMATION:   Natural supplements: Magnesium Oxide or Magnesium Glycinate 500 mg at bed (up to 800 mg daily) Coenzyme Q10 300 mg in AM Vitamin B2- 200 mg twice a day   Add 1 supplement at a time since even natural supplements can have undesirable side effects. You can sometimes buy supplements cheaper (especially Coenzyme Q10) at www.WebmailGuide.co.za or at [facility].  Migraine with aura: There is increased risk for stroke in women with migraine with aura and a contraindication for the combined contraceptive pill for use by women who have migraine with aura. The risk for women with migraine without aura is lower. However other risk factors like smoking are far more likely to increase stroke risk than migraine. There is a recommendation for no smoking and for the use of OCPs without estrogen such as progestogen only pills particularly for women with migraine with aura.Marland Kitchen People who have migraine headaches with auras may be 3 times more likely to have a stroke caused by a blood clot, compared to migraine patients who don't see auras. Women who take hormone-replacement therapy may be 30 percent more likely to suffer a clot-based stroke than women not taking medication containing estrogen. Other risk factors like smoking and high blood pressure may be  much more important.    Vitamins and herbs that show potential:   Magnesium: Magnesium (250 mg twice a day or 500 mg at bed) has a  relaxant effect on smooth muscles such as blood vessels. Individuals suffering from frequent or daily headache usually have low magnesium levels which can be increase with daily supplementation of 400-750 mg. Three trials found 40-90% average headache reduction  when used as a preventative. Magnesium may help with headaches are aura, the best evidence for magnesium is for migraine with aura is its thought to stop the cortical spreading depression we believe is the pathophysiology of migraine aura.Magnesium also demonstrated the benefit in menstrually related migraine.  Magnesium is part of the messenger system in the serotonin cascade and it is a good muscle relaxant.  It is also useful for constipation which can be a side effect of other medications used to treat migraine. Good sources include nuts, whole grains, and tomatoes. Side Effects: loose stool/diarrhea  Riboflavin (vitamin B 2) 200 mg twice a day. This vitamin assists nerve cells in the production of ATP a principal energy storing molecule.  It is necessary for many chemical reactions in the body.  There have been at least 3 clinical trials of riboflavin using 400 mg per day all of which suggested that migraine frequency can be decreased.  All 3 trials showed significant improvement in over half of migraine sufferers.  The supplement is found in bread, cereal, milk, meat, and poultry.  Most Americans get more riboflavin than the recommended daily allowance, however riboflavin deficiency is not necessary for the supplements to help prevent headache. Side effects: energizing, green urine   Coenzyme Q10: This is  present in almost all cells in the body and is critical component for the conversion of energy.  Recent studies have shown that a nutritional supplement of CoQ10 can reduce the frequency of migraine attacks by improving the energy production of cells as with riboflavin.  Doses of 150 mg twice a day have been shown to be effective.   Melatonin:  Increasing evidence shows correlation between melatonin secretion and headache conditions.  Melatonin supplementation has decreased headache intensity and duration.  It is widely used as a sleep aid.  Sleep is natures way of dealing with migraine.  A dose of 3 mg is recommended to start for headaches including cluster headache. Higher doses up to 15 mg has been reviewed for use in Cluster headache and have been used. The rationale behind using melatonin for cluster is that many theories regarding the cause of Cluster headache center around the disruption of the normal circadian rhythm in the brain.  This helps restore the normal circadian rhythm.   HEADACHE DIET: Foods and beverages which may trigger migraine Note that only 20% of headache patients are food sensitive. You will know if you are food sensitive if you get a headache consistently 20 minutes to 2 hours after eating a certain food. Only cut out a food if it causes headaches, otherwise you might remove foods you enjoy! What matters most for diet is to eat a well balanced healthy diet full of vegetables and low fat protein, and to not miss meals.   Chocolate, other sweets ALL cheeses except cottage and cream cheese Dairy products, yogurt, sour cream, ice cream Liver Meat extracts (Bovril, Marmite, meat tenderizers) Meats or fish which have undergone aging, fermenting, pickling or smoking. These include: Hotdogs,salami,Lox,sausage, mortadellas,smoked salmon, pepperoni, Pickled herring Pods of broad bean (English beans, Chinese pea pods, [place] (fava) beans, lima and navy beans Ripe avocado, ripe banana Yeast extracts or active yeast preparations such as Brewer's or Fleishman's (commercial bakes goods are permitted) Tomato based foods, pizza (lasagna, etc.)   MSG (monosodium glutamate) is disguised as many things; look for these common aliases: Monopotassium glutamate Autolysed yeast Hydrolysed protein Sodium  caseinate "flavorings" "all natural preservatives" Nutrasweet   Avoid all other foods that convincingly provoke headaches.   Resources: The Dizzy [place]air Laundry Your Headache Diet, migrainestrong.com  https://zamora-andrews.com/   Caffeine and Migraine For patients that have migraine, caffeine intake more than 3 days per week can lead to dependency and increased migraine frequency. I would recommend cutting back on your caffeine intake as best you can. The recommended amount of caffeine is 200-300 mg daily, although migraine patients may experience dependency at even lower doses. While you may notice an increase in headache temporarily, cutting back will be helpful for headaches in the long run. For more information on caffeine and migraine, visit: https://americanmigrainefoundation.org/resource-library/caffeine-and-migraine/   Headache Prevention Strategies:   1. Maintain a headache diary; learn to identify and avoid triggers.  - This can be a simple note where you log when you had a headache, associated symptoms, and medications used - There are several smartphone apps developed to help track migraines: Migraine Buddy, Migraine Monitor, Curelator N1-Headache App   Common triggers include: Emotional triggers: Emotional/Upset family or friends Emotional/Upset occupation Business reversal/success Anticipation anxiety Crisis-serious Post-crisis periodNew job/position   Physical triggers: Vacation Day Weekend Strenuous Exercise High Altitude Location New Move Menstrual Day Physical Illness Oversleep/Not enough sleep Weather changes Light: Photophobia or light sesnitivity treatment involves a balance between desensitization and reduction in overly strong input.  Use dark polarized glasses outside, but not inside. Avoid bright or fluorescent light, but do not dim environment to the point that going into a normally lit room hurts. Consider  FL-41 tint lenses, which reduce the most irritating wavelengths without blocking too much light.  These can be obtained at axonoptics.com or theraspecs.com Foods: see list above.   2. Limit use of acute treatments (over-the-counter medications, triptans, etc.) to no more than 2 days per week or 10 days per month to prevent medication overuse headache (rebound headache).     3. Follow a regular schedule (including weekends and holidays): Don't skip meals. Eat a balanced diet. 8 hours of sleep nightly. Minimize stress. Exercise 30 minutes per day. Being overweight is associated with a 5 times increased risk of chronic migraine. Keep well hydrated and drink 6-8 glasses of water per day.   4. Initiate non-pharmacologic measures at the earliest onset of your headache. Rest and quiet environment. Relax and reduce stress. Breathe2Relax is a free app that can instruct you on    some simple relaxtion and breathing techniques. Http://Dawnbuse.com is a    free website that provides teaching videos on relaxation.  Also, there are  many apps that   can be downloaded for "mindful" relaxation.  An app called YOGA NIDRA will help walk you through mindfulness. Another app called Calm can be downloaded to give you a structured mindfulness guide with daily reminders and skill development. Headspace for guided meditation Mindfulness Based Stress Reduction Online Course: www.palousemindfulness.com Cold compresses.   5. Don't wait!! Take the maximum allowable dosage of prescribed medication at the first sign of migraine.   6. Compliance:  Take prescribed medication regularly as directed and at the first sign of a migraine.   7. Communicate:  Call your physician when problems arise, especially if your headaches change, increase in frequency/severity, or become associated with neurological symptoms (weakness, numbness, slurred speech, etc.). Proceed to emergency room if you experience new or worsening symptoms or  symptoms do not resolve, if you have new neurologic symptoms or if headache is severe, or for any concerning symptom.   8. Headache/pain management therapies: Consider various complementary methods, including medication, behavioral therapy, psychological counselling, biofeedback, massage therapy, acupuncture, dry needling, and other modalities.  Such measures may reduce the need for medications. Counseling for pain management, where patients learn to function and ignore/minimize their pain, seems to work very well.   9. Recommend changing family's attention and focus away from patient's headaches. Instead, emphasize daily activities. If first question of day is 'How are your headaches/Do you have a headache today?', then patient will constantly think about headaches, thus making them worse. Goal is to re-direct attention away from headaches, toward daily activities and other distractions.   10. Helpful Websites: www.AmericanHeadacheSociety.org PatentHood.ch www.headaches.org TightMarket.nl www.achenet.org

## 2023-03-04 NOTE — Progress Notes (Unsigned)
PATIENT: Melissa Escobar DOB: [birth year]  REASON FOR VISIT: follow up HISTORY FROM: patient  Virtual Visit via Telephone Note  I connected with Josefa Half on 03/06/23 at  9:45 AM EDT by telephone and verified that I am speaking with the correct person using two identifiers.   I discussed the limitations, risks, security and privacy concerns of performing an evaluation and management service by telephone and the availability of in person appointments. I also discussed with the patient that there may be a patient responsible charge related to this service. The patient expressed understanding and agreed to proceed.   History of Present Illness:  03/06/23 ALL (Mychart): DOLLENE MALLERY is a 27 y.o. female here today for follow up for migraines. She was last seen by Dr Frances Furbish 02/2022 and was doing well on Ubrelvy as needed. Since, she reports doing very well. She may have 2-4 migraines per month. Bernita Raisin works very well. Has not needed to repeat dose. May take with Tylenol or Advil. No seizure activity. She continues to work at [employer].   03/05/2022 SA:  She reports doing fairly well.  Migraines respond to Ubrelvy 50 mg strength as needed.  She has been trying to manage her triggers.  She has made some lifestyle changes, has eliminated alcohol, does not smoke any THC any longer, does vape nicotine but does not smoke cigarettes.  She has had significant weight gain in the past couple years.  She is trying to hydrate well and manage weight.  She works at [employer].  She tries to make enough time for sleep but sleep schedule varies because of work schedule varies.  She may be in bed somewhere between 10 and midnight and rise time may be as early as 4 AM and as late as 10 AM.  No seizure-like events, no visual symptoms, no new neurological symptoms.  She has not had an eye examination in years.  She does not have any visual complaints but is reminded to get checked out for  baseline.  She did present to the emergency room in April 2023 for a severe migraine with nausea.  She reports that Phenergan has helped her nausea.  She would like a refill on it.   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 She continues Ubrelvy for abortive therapy. She is doing very well. No seizure activity. She is doing well with sobriety. She is working at [employer] and is being promoted. She has been working more hours. She quit eating as much junk food and is eating healthier. She is drinking more water. She has about 2-3 headache days per month, usually around menstrual cycle. Bernita Raisin works very well. She has lost about 10 pounds over the past 6 months but relates this to lifestyle changes.    05/04/2020 ALL:  TYLAR MERENDINO is a 27 y.o. 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  She continues to participate in NA and denies recent use of Xanax or other street drugs.   Migraines are well managed with Ubrelvy for abortive therapy. She uses medication about 2-3 times per month on average but has used it up to 4-6 times. Headaches are worse with skipped meals or menstrual cycle.   She denies any concerns of seizure activity since last being seen. She wishes to resume driving privileges.  Observations/Objective:  Generalized: Well developed, in no acute distress  Mentation: Alert oriented to time, place, history taking. Follows all commands speech and language fluent   Assessment and Plan:  27 y.o. year old female  has a past medical history of ADHD, Anxiety, Asthma, Depression, IBS (irritable bowel syndrome), Migraine, and Mood disorder (HCC). here with    ICD-10-CM   1. Migraine without aura and without status migrainosus, not intractable  G43.009      Danella Deis is doing very well. We will continue Qulipta 100mg  as  needed. Continue healthy lifestyle habits and follow up with PCP. Follow up with me in 1 year.   No orders of the defined types were placed in this encounter.   Meds ordered this encounter  Medications   Ubrogepant (UBRELVY) 100 MG TABS    Sig: TAKE 1 TABLET BY AS NEEDED FOR MIGRAINE. MAY REPEAT ONCE IN 2 HOURS MAX OF 2 TABLETS IN 24 HOURS.    Dispense:  16 tablet    Refill:  5    Order Specific Question:   Supervising Provider    Answer:   Anson Fret [id number]     Follow Up Instructions:  I discussed the assessment and treatment plan with the patient. The patient was provided an opportunity to ask questions and all were answered. The patient agreed with the plan and demonstrated an understanding of the instructions.   The patient was advised to call back or seek an in-person evaluation if the symptoms worsen or if the condition fails to improve as anticipated.  I provided 15 minutes of non-face-to-face time during this encounter. Patient located at their place of residence during Mychart visit. Provider is in the office.    Shawnie Dapper, NP

## 2023-03-06 ENCOUNTER — Telehealth (INDEPENDENT_AMBULATORY_CARE_PROVIDER_SITE_OTHER): Payer: Commercial Managed Care - HMO | Admitting: Family Medicine

## 2023-03-06 ENCOUNTER — Encounter: Payer: Self-pay | Admitting: Family Medicine

## 2023-03-06 DIAGNOSIS — G43009 Migraine without aura, not intractable, without status migrainosus: Secondary | ICD-10-CM | POA: Diagnosis not present

## 2023-03-06 MED ORDER — UBRELVY 100 MG PO TABS: ORAL_TABLET | ORAL | 5 refills | Status: DC

## 2023-05-07 ENCOUNTER — Telehealth: Payer: Self-pay | Admitting: Pharmacy Technician

## 2023-05-07 NOTE — Telephone Encounter (Signed)
Pharmacy Patient Advocate Encounter  Received notification from [employer] that Prior Authorization for Ubrelvy 50MG  tablets has been APPROVED from 05/07/2023 to 12/102025   PA #/Case ID/Reference #: [id number] Key: [id number]

## 2023-05-25 ENCOUNTER — Other Ambulatory Visit: Payer: Self-pay | Admitting: Neurology

## 2023-06-30 ENCOUNTER — Other Ambulatory Visit: Payer: Self-pay | Admitting: Family Medicine

## 2023-07-01 ENCOUNTER — Encounter: Payer: Self-pay | Admitting: Family Medicine

## 2023-07-02 ENCOUNTER — Telehealth: Payer: Self-pay | Admitting: *Deleted

## 2023-07-02 ENCOUNTER — Telehealth: Payer: Self-pay

## 2023-07-02 ENCOUNTER — Other Ambulatory Visit (HOSPITAL_COMMUNITY): Payer: Self-pay

## 2023-07-02 NOTE — Telephone Encounter (Signed)
Pt sent mychart that PA Ubrelvy 100mg  needd

## 2023-07-02 NOTE — Telephone Encounter (Signed)
Sent pt mychart message about approval.

## 2023-07-02 NOTE — Telephone Encounter (Signed)
Pharmacy Patient Advocate Encounter  Received notification from [employer] that Prior Authorization for Ubrelvy 100MG  tablets has been APPROVED from 07/02/2023 to 07/01/2024   PA #/Case ID/Reference #: PA Case ID #: [id number]   Key: [id number]

## 2023-07-03 NOTE — Telephone Encounter (Signed)
 PA request has been Approved. New Encounter created for follow up. For additional info see Pharmacy Prior Auth telephone encounter from 07/02/2023.

## 2024-01-02 ENCOUNTER — Telehealth: Payer: Self-pay | Admitting: Family Medicine

## 2024-01-02 MED ORDER — UBRELVY 100 MG PO TABS: ORAL_TABLET | ORAL | 5 refills | Status: DC

## 2024-01-02 NOTE — Telephone Encounter (Signed)
 Last saw Amy Lomax,NP 03/06/23. Next follow up 03/10/24. E-scribed refill.

## 2024-01-02 NOTE — Telephone Encounter (Signed)
 Pt is needing a refill on her UBRELVY  100 MG TABS sent in to the [facility] on [address]

## 2024-03-09 NOTE — Patient Instructions (Incomplete)

## 2024-03-09 NOTE — Progress Notes (Deleted)
 PATIENT: Melissa Escobar DOB: 1995-11-05  REASON FOR VISIT: follow up HISTORY FROM: patient  Virtual Visit via Mycahrt Video Note  I connected with Manuelita GORMAN Gainer on 03/09/24 at  8:30 AM EDT by Mychart Video and verified that I am speaking with the correct person using two identifiers.   I discussed the limitations, risks, security and privacy concerns of performing an evaluation and management service by Mychart video and the availability of in person appointments. I also discussed with the patient that there may be a patient responsible charge related to this service. The patient expressed understanding and agreed to proceed.   History of Present Illness:  03/09/24 ALL (Mychart): Luciel returns for follow up for migraines. She continues Ubrelvy  as needed.   03/05/2024 ALL (Mychart): DYAN LABARBERA is a 28 y.o. female here today for follow up for migraines. She was last seen by Dr Buck 02/2022 and was doing well on Ubrelvy  as needed. Since, she reports doing very well. She may have 2-4 migraines per month. Ubrelvy  works very well. Has not needed to repeat dose. May take with Tylenol  or Advil . No seizure activity. She continues to work at [employer].   03/05/2022 SA:  She reports doing fairly well.  Migraines respond to Ubrelvy  50 mg strength as needed.  She has been trying to manage her triggers.  She has made some lifestyle changes, has eliminated alcohol, does not smoke any THC any longer, does vape nicotine but does not smoke cigarettes.  She has had significant weight gain in the past couple years.  She is trying to hydrate well and manage weight.  She works at [employer].  She tries to make enough time for sleep but sleep schedule varies because of work schedule varies.  She may be in bed somewhere between 10 and midnight and rise time may be as early as 4 AM and as late as 10 AM.  No seizure-like events, no visual symptoms, no new neurological symptoms.  She has  not had an eye examination in years.  She does not have any visual complaints but is reminded to get checked out for baseline.  She did present to the emergency room in April 2023 for a severe migraine with nausea.  She reports that Phenergan  has helped her nausea.  She would like a refill on it.   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 She continues Ubrelvy  for abortive therapy. She is doing very well. No seizure activity. She is doing well with sobriety. She is working at [employer] and is being promoted. She has been working more hours. She quit eating as much junk food and is eating healthier. She is drinking more water. She has about 2-3 headache days per month, usually around menstrual cycle. Ubrelvy  works very well. She has lost about 10 pounds over the past 6 months but relates this to lifestyle changes.    05/04/2020 ALL:  ARIA JARRARD is a 28 y.o. 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  She continues to participate in NA and denies recent use of Xanax or other street drugs.   Migraines are well managed with Ubrelvy  for abortive therapy. She uses medication about 2-3 times per month on average but has used it up to 4-6 times. Headaches are worse with skipped meals or menstrual cycle.  She denies any concerns of seizure activity since last being seen. She wishes to resume driving privileges.    Observations/Objective:  Generalized: Well developed, in no acute distress  Mentation: Alert oriented to time, place, history taking. Follows all commands speech and language fluent   Assessment and Plan:  28 y.o. year old female  has a past medical history of ADHD, Anxiety, Asthma, Depression, IBS (irritable bowel syndrome), Migraine, and Mood disorder. here with  No diagnosis found.  [name] is doing very well. We will  continue Qulipta 100mg  as needed. Continue healthy lifestyle habits and follow up with PCP. Follow up with me in 1 year.   No orders of the defined types were placed in this encounter.   No orders of the defined types were placed in this encounter.    Follow Up Instructions:  I discussed the assessment and treatment plan with the patient. The patient was provided an opportunity to ask questions and all were answered. The patient agreed with the plan and demonstrated an understanding of the instructions.   The patient was advised to call back or seek an in-person evaluation if the symptoms worsen or if the condition fails to improve as anticipated.  I provided 15 minutes of non-face-to-face time during this encounter. Patient located at their place of residence during Mychart visit. Provider is in the office.    Avalon Coppinger, NP

## 2024-03-10 ENCOUNTER — Telehealth: Payer: No Typology Code available for payment source | Admitting: Family Medicine

## 2024-03-30 NOTE — Progress Notes (Unsigned)
 PATIENT: Melissa Escobar DOB: April 27, 1996  REASON FOR VISIT: follow up HISTORY FROM: patient  Virtual Visit via Mycahrt Video Note  I connected with Melissa Escobar on 03/31/24 at  9:00 AM EST by Mychart Video and verified that I am speaking with the correct person using two identifiers.   I discussed the limitations, risks, security and privacy concerns of performing an evaluation and management service by Mychart video and the availability of in person appointments. I also discussed with the patient that there may be a patient responsible charge related to this service. The patient expressed understanding and agreed to proceed.   History of Present Illness:  03/31/24 ALL (Mychart): [name] returns for follow up for migraines. She continues Ubrelvy  as needed. She reports doing well. She may have 4-8 migraines per month. Usually around menstrual cycle. Ubrelvy  works well. Sometimes she can take 1/2 tablet. Rarely has to repeat dose.   03/05/2024 ALL (Mychart): Melissa Escobar is a 28 y.o. female here today for follow up for migraines. She was last seen by Dr Buck 02/2022 and was doing well on Ubrelvy  as needed. Since, she reports doing very well. She may have 2-4 migraines per month. Ubrelvy  works very well. Has not needed to repeat dose. May take with Tylenol  or Advil . No seizure activity. She continues to work at [employer].   03/05/2022 SA:  She reports doing fairly well.  Migraines respond to Ubrelvy  50 mg strength as needed.  She has been trying to manage her triggers.  She has made some lifestyle changes, has eliminated alcohol, does not smoke any THC any longer, does vape nicotine but does not smoke cigarettes.  She has had significant weight gain in the past couple years.  She is trying to hydrate well and manage weight.  She works at [employer].  She tries to make enough time for sleep but sleep schedule varies because of work schedule varies.  She may be in bed  somewhere between 10 and midnight and rise time may be as early as 4 AM and as late as 10 AM.  No seizure-like events, no visual symptoms, no new neurological symptoms.  She has not had an eye examination in years.  She does not have any visual complaints but is reminded to get checked out for baseline.  She did present to the emergency room in April 2023 for a severe migraine with nausea.  She reports that Phenergan  has helped her nausea.  She would like a refill on it.   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 She continues Ubrelvy  for abortive therapy. She is doing very well. No seizure activity. She is doing well with sobriety. She is working at [employer] and is being promoted. She has been working more hours. She quit eating as much junk food and is eating healthier. She is drinking more water. She has about 2-3 headache days per month, usually around menstrual cycle. Ubrelvy  works very well. She has lost about 10 pounds over the past 6 months but relates this to lifestyle changes.   05/04/2020 ALL:  Melissa Escobar is a 28 y.o. 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  She continues to participate in NA and denies recent use of Xanax or other street drugs.   Migraines are well managed with Ubrelvy  for abortive therapy.  She uses medication about 2-3 times per month on average but has used it up to 4-6 times. Headaches are worse with skipped meals or menstrual cycle.   She denies any concerns of seizure activity since last being seen. She wishes to resume driving privileges.    Observations/Objective:  Generalized: Well developed, in no acute distress  Mentation: Alert oriented to time, place, history taking. Follows all commands speech and language fluent   Assessment and Plan:  28 y.o. year old female  has a past medical  history of ADHD, Anxiety, Asthma, Depression, IBS (irritable bowel syndrome), Migraine, and Mood disorder. here with    ICD-10-CM   1. Migraine without aura and without status migrainosus, not intractable  G43.009       Melissa Escobar is doing very well. We will continue Ubrelvy  100mg  as needed. Continue healthy lifestyle habits and follow up with PCP. Follow up with me in 1 year.   No orders of the defined types were placed in this encounter.   Meds ordered this encounter  Medications   Ubrogepant  (UBRELVY ) 100 MG TABS    Sig: TAKE 1 TABLET BY AS NEEDED FOR MIGRAINE. MAY REPEAT ONCE IN 2 HOURS MAX OF 2 TABLETS IN 24 HOURS.    Dispense:  16 tablet    Refill:  11    Supervising Provider:   YAN, YIJUN [id number]     Follow Up Instructions:  I discussed the assessment and treatment plan with the patient. The patient was provided an opportunity to ask questions and all were answered. The patient agreed with the plan and demonstrated an understanding of the instructions.   The patient was advised to call back or seek an in-person evaluation if the symptoms worsen or if the condition fails to improve as anticipated.  I provided 15 minutes of non-face-to-face time during this encounter. Patient located at their place of residence during Mychart visit. Provider is in the office.    Irini Leet, NP

## 2024-03-31 ENCOUNTER — Telehealth: Admitting: Family Medicine

## 2024-03-31 ENCOUNTER — Encounter: Payer: Self-pay | Admitting: Family Medicine

## 2024-03-31 DIAGNOSIS — G43009 Migraine without aura, not intractable, without status migrainosus: Secondary | ICD-10-CM | POA: Diagnosis not present

## 2024-03-31 MED ORDER — UBRELVY 100 MG PO TABS: ORAL_TABLET | ORAL | 11 refills | Status: AC

## 2024-03-31 NOTE — Patient Instructions (Signed)
Below is our plan:  We will continue Ubrelvy as needed.   Please make sure you are staying well hydrated. I recommend 50-60 ounces daily. Well balanced diet and regular exercise encouraged. Consistent sleep schedule with 6-8 hours recommended.   Please continue follow up with care team as directed.   Follow up with me in 1 year   You may receive a survey regarding today's visit. I encourage you to leave honest feed back as I do use this information to improve patient care. Thank you for seeing me today!   GENERAL HEADACHE INFORMATION:   Natural supplements: Magnesium Oxide or Magnesium Glycinate 500 mg at bed (up to 800 mg daily) Coenzyme Q10 300 mg in AM Vitamin B2- 200 mg twice a day   Add 1 supplement at a time since even natural supplements can have undesirable side effects. You can sometimes buy supplements cheaper (especially Coenzyme Q10) at www.WebmailGuide.co.za or at [facility].  Migraine with aura: There is increased risk for stroke in women with migraine with aura and a contraindication for the combined contraceptive pill for use by women who have migraine with aura. The risk for women with migraine without aura is lower. However other risk factors like smoking are far more likely to increase stroke risk than migraine. There is a recommendation for no smoking and for the use of OCPs without estrogen such as progestogen only pills particularly for women with migraine with aura.Marland Kitchen People who have migraine headaches with auras may be 3 times more likely to have a stroke caused by a blood clot, compared to migraine patients who don't see auras. Women who take hormone-replacement therapy may be 30 percent more likely to suffer a clot-based stroke than women not taking medication containing estrogen. Other risk factors like smoking and high blood pressure may be  much more important.    Vitamins and herbs that show potential:   Magnesium: Magnesium (250 mg twice a day or 500 mg at bed) has a  relaxant effect on smooth muscles such as blood vessels. Individuals suffering from frequent or daily headache usually have low magnesium levels which can be increase with daily supplementation of 400-750 mg. Three trials found 40-90% average headache reduction  when used as a preventative. Magnesium may help with headaches are aura, the best evidence for magnesium is for migraine with aura is its thought to stop the cortical spreading depression we believe is the pathophysiology of migraine aura.Magnesium also demonstrated the benefit in menstrually related migraine.  Magnesium is part of the messenger system in the serotonin cascade and it is a good muscle relaxant.  It is also useful for constipation which can be a side effect of other medications used to treat migraine. Good sources include nuts, whole grains, and tomatoes. Side Effects: loose stool/diarrhea  Riboflavin (vitamin B 2) 200 mg twice a day. This vitamin assists nerve cells in the production of ATP a principal energy storing molecule.  It is necessary for many chemical reactions in the body.  There have been at least 3 clinical trials of riboflavin using 400 mg per day all of which suggested that migraine frequency can be decreased.  All 3 trials showed significant improvement in over half of migraine sufferers.  The supplement is found in bread, cereal, milk, meat, and poultry.  Most Americans get more riboflavin than the recommended daily allowance, however riboflavin deficiency is not necessary for the supplements to help prevent headache. Side effects: energizing, green urine   Coenzyme Q10: This is  present in almost all cells in the body and is critical component for the conversion of energy.  Recent studies have shown that a nutritional supplement of CoQ10 can reduce the frequency of migraine attacks by improving the energy production of cells as with riboflavin.  Doses of 150 mg twice a day have been shown to be effective.   Melatonin:  Increasing evidence shows correlation between melatonin secretion and headache conditions.  Melatonin supplementation has decreased headache intensity and duration.  It is widely used as a sleep aid.  Sleep is natures way of dealing with migraine.  A dose of 3 mg is recommended to start for headaches including cluster headache. Higher doses up to 15 mg has been reviewed for use in Cluster headache and have been used. The rationale behind using melatonin for cluster is that many theories regarding the cause of Cluster headache center around the disruption of the normal circadian rhythm in the brain.  This helps restore the normal circadian rhythm.   HEADACHE DIET: Foods and beverages which may trigger migraine Note that only 20% of headache patients are food sensitive. You will know if you are food sensitive if you get a headache consistently 20 minutes to 2 hours after eating a certain food. Only cut out a food if it causes headaches, otherwise you might remove foods you enjoy! What matters most for diet is to eat a well balanced healthy diet full of vegetables and low fat protein, and to not miss meals.   Chocolate, other sweets ALL cheeses except cottage and cream cheese Dairy products, yogurt, sour cream, ice cream Liver Meat extracts (Bovril, Marmite, meat tenderizers) Meats or fish which have undergone aging, fermenting, pickling or smoking. These include: Hotdogs,salami,Lox,sausage, mortadellas,smoked salmon, pepperoni, Pickled herring Pods of broad bean (English beans, Chinese pea pods, [place] (fava) beans, lima and navy beans Ripe avocado, ripe banana Yeast extracts or active yeast preparations such as Brewer's or Fleishman's (commercial bakes goods are permitted) Tomato based foods, pizza (lasagna, etc.)   MSG (monosodium glutamate) is disguised as many things; look for these common aliases: Monopotassium glutamate Autolysed yeast Hydrolysed protein Sodium  caseinate "flavorings" "all natural preservatives" Nutrasweet   Avoid all other foods that convincingly provoke headaches.   Resources: The Dizzy [place]air Laundry Your Headache Diet, migrainestrong.com  https://zamora-andrews.com/   Caffeine and Migraine For patients that have migraine, caffeine intake more than 3 days per week can lead to dependency and increased migraine frequency. I would recommend cutting back on your caffeine intake as best you can. The recommended amount of caffeine is 200-300 mg daily, although migraine patients may experience dependency at even lower doses. While you may notice an increase in headache temporarily, cutting back will be helpful for headaches in the long run. For more information on caffeine and migraine, visit: https://americanmigrainefoundation.org/resource-library/caffeine-and-migraine/   Headache Prevention Strategies:   1. Maintain a headache diary; learn to identify and avoid triggers.  - This can be a simple note where you log when you had a headache, associated symptoms, and medications used - There are several smartphone apps developed to help track migraines: Migraine Buddy, Migraine Monitor, Curelator N1-Headache App   Common triggers include: Emotional triggers: Emotional/Upset family or friends Emotional/Upset occupation Business reversal/success Anticipation anxiety Crisis-serious Post-crisis periodNew job/position   Physical triggers: Vacation Day Weekend Strenuous Exercise High Altitude Location New Move Menstrual Day Physical Illness Oversleep/Not enough sleep Weather changes Light: Photophobia or light sesnitivity treatment involves a balance between desensitization and reduction in overly strong input.  Use dark polarized glasses outside, but not inside. Avoid bright or fluorescent light, but do not dim environment to the point that going into a normally lit room hurts. Consider  FL-41 tint lenses, which reduce the most irritating wavelengths without blocking too much light.  These can be obtained at axonoptics.com or theraspecs.com Foods: see list above.   2. Limit use of acute treatments (over-the-counter medications, triptans, etc.) to no more than 2 days per week or 10 days per month to prevent medication overuse headache (rebound headache).     3. Follow a regular schedule (including weekends and holidays): Don't skip meals. Eat a balanced diet. 8 hours of sleep nightly. Minimize stress. Exercise 30 minutes per day. Being overweight is associated with a 5 times increased risk of chronic migraine. Keep well hydrated and drink 6-8 glasses of water per day.   4. Initiate non-pharmacologic measures at the earliest onset of your headache. Rest and quiet environment. Relax and reduce stress. Breathe2Relax is a free app that can instruct you on    some simple relaxtion and breathing techniques. Http://Dawnbuse.com is a    free website that provides teaching videos on relaxation.  Also, there are  many apps that   can be downloaded for "mindful" relaxation.  An app called YOGA NIDRA will help walk you through mindfulness. Another app called Calm can be downloaded to give you a structured mindfulness guide with daily reminders and skill development. Headspace for guided meditation Mindfulness Based Stress Reduction Online Course: www.palousemindfulness.com Cold compresses.   5. Don't wait!! Take the maximum allowable dosage of prescribed medication at the first sign of migraine.   6. Compliance:  Take prescribed medication regularly as directed and at the first sign of a migraine.   7. Communicate:  Call your physician when problems arise, especially if your headaches change, increase in frequency/severity, or become associated with neurological symptoms (weakness, numbness, slurred speech, etc.). Proceed to emergency room if you experience new or worsening symptoms or  symptoms do not resolve, if you have new neurologic symptoms or if headache is severe, or for any concerning symptom.   8. Headache/pain management therapies: Consider various complementary methods, including medication, behavioral therapy, psychological counselling, biofeedback, massage therapy, acupuncture, dry needling, and other modalities.  Such measures may reduce the need for medications. Counseling for pain management, where patients learn to function and ignore/minimize their pain, seems to work very well.   9. Recommend changing family's attention and focus away from patient's headaches. Instead, emphasize daily activities. If first question of day is 'How are your headaches/Do you have a headache today?', then patient will constantly think about headaches, thus making them worse. Goal is to re-direct attention away from headaches, toward daily activities and other distractions.   10. Helpful Websites: www.AmericanHeadacheSociety.org PatentHood.ch www.headaches.org TightMarket.nl www.achenet.org

## 2024-05-08 ENCOUNTER — Other Ambulatory Visit (HOSPITAL_COMMUNITY): Payer: Self-pay

## 2024-07-01 ENCOUNTER — Telehealth: Payer: Self-pay | Admitting: Family Medicine

## 2024-07-01 NOTE — Telephone Encounter (Signed)
 Called pt's mother back and asked for pt's insurance information so I can call to try and get her script refilled.

## 2024-07-01 NOTE — Telephone Encounter (Signed)
 Pt's mother called stating that [facility] informed her that the pt's new insurance has sent over a form asking for the reason the pt is needing the Ubrogepant  (UBRELVY ) 100 MG TABS so that they can approve it. Pt only has 2 left and is needing a refill approved soon. Please advise.

## 2024-07-02 NOTE — Telephone Encounter (Signed)
 Patient's mother called back with  prescription ID card information: [facility] RxBIN# [id number] RxPCN# [id number] # [id number] Insurer no: [id number] ID# [id number] Insurance contact info: [phone] Effective 05/28/2024

## 2025-04-20 ENCOUNTER — Telehealth: Admitting: Family Medicine
